# Patient Record
Sex: Male | Born: 1970 | Race: White | Hispanic: No | Marital: Married | Smoking: Never smoker
Health system: Southern US, Community
[De-identification: ages and names within clinical notes are randomized; demographics above are authoritative.]

## PROBLEM LIST (undated history)

## (undated) DIAGNOSIS — I10 Essential (primary) hypertension: Secondary | ICD-10-CM

## (undated) HISTORY — PX: CHOLECYSTECTOMY: SHX55

---

## 2011-04-06 ENCOUNTER — Other Ambulatory Visit: Payer: Self-pay | Admitting: Family Medicine

## 2011-04-06 ENCOUNTER — Encounter: Payer: Self-pay | Admitting: Family Medicine

## 2011-04-06 ENCOUNTER — Inpatient Hospital Stay (INDEPENDENT_AMBULATORY_CARE_PROVIDER_SITE_OTHER)
Admission: RE | Admit: 2011-04-06 | Discharge: 2011-04-06 | Disposition: A | Discharge status: Home / Self Care | Payer: BC Managed Care – PPO | Source: Ambulatory Visit | Attending: Family Medicine | Admitting: Family Medicine

## 2011-04-06 ENCOUNTER — Ambulatory Visit
Admission: RE | Admit: 2011-04-06 | Discharge: 2011-04-06 | Disposition: A | Discharge status: Home / Self Care | Payer: BC Managed Care – PPO | Source: Ambulatory Visit | Attending: Family Medicine | Admitting: Family Medicine

## 2011-04-06 DIAGNOSIS — J0100 Acute maxillary sinusitis, unspecified: Secondary | ICD-10-CM

## 2011-04-06 DIAGNOSIS — J3489 Other specified disorders of nose and nasal sinuses: Secondary | ICD-10-CM

## 2011-04-06 DIAGNOSIS — J029 Acute pharyngitis, unspecified: Secondary | ICD-10-CM

## 2011-04-06 DIAGNOSIS — H9209 Otalgia, unspecified ear: Secondary | ICD-10-CM

## 2011-04-09 ENCOUNTER — Telehealth (INDEPENDENT_AMBULATORY_CARE_PROVIDER_SITE_OTHER): Payer: Self-pay | Admitting: *Deleted

## 2011-10-20 NOTE — Progress Notes (Signed)
Summary: SHARP PAIN IN THROAT/TP(rm4)   Vital Signs:  Patient Profile:   40 Years Old Male CC:      SORE THROAT/SINUS/EAR INFECTION Height:     68 inches Weight:      166.4 pounds O2 Sat:      98 % O2 treatment:    Room Air Temp:     98.8 degrees F oral Pulse rate:   64 / minute Resp:     18 per minute BP sitting:   116 / 81  (left arm) Cuff size:   regular  Vitals Entered By: Linton Flemings RN (Apr 06, 2011 5:54 PM)                  Updated Prior Medication List: * AMOXICILLIN  CAPS (AMOXICILLIN) TWICE DAILY  Current Allergies: No known allergies History of Present Illness Chief Complaint: SORE THROAT/SINUS/EAR INFECTION History of Present Illness:  Subjective:  Patient complains of 2 month history of pressure in his right cheek and facial area, but with minimal sinus congestion.  For about a week he has had a mild right earache.  Four days ago he visited the [facility] and was started on amoxicillin 875mg  two times a day without much improvement.  Today he developed a sore throat.  No fevers, chills, and sweats.  No cough.  REVIEW OF SYSTEMS Constitutional Symptoms      Denies fever, chills, night sweats, weight loss, weight gain, and fatigue.  Eyes       Denies change in vision, eye pain, eye discharge, glasses, contact lenses, and eye surgery. Ear/Nose/Throat/Mouth       Complains of ear pain, sinus problems, and sore throat.      Denies hearing loss/aids, change in hearing, ear discharge, dizziness, frequent runny nose, frequent nose bleeds, hoarseness, and tooth pain or bleeding.  Respiratory       Denies dry cough, productive cough, wheezing, shortness of breath, asthma, bronchitis, and emphysema/COPD.  Cardiovascular       Denies murmurs, chest pain, and tires easily with exhertion.    Gastrointestinal       Denies stomach pain, nausea/vomiting, diarrhea, constipation, blood in bowel movements, and indigestion. Genitourniary       Denies painful  urination, kidney stones, and loss of urinary control. Neurological       Complains of headaches.      Denies paralysis, seizures, and fainting/blackouts. Musculoskeletal       Denies muscle pain, joint pain, joint stiffness, decreased range of motion, redness, swelling, muscle weakness, and gout.  Skin       Denies bruising, unusual mles/lumps or sores, and hair/skin or nail changes.  Psych       Denies mood changes, temper/anger issues, anxiety/stress, speech problems, depression, and sleep problems. Other Comments: symptoms started Wed tx w/ ABT for sinus infection w/out relief, sore throat started today and cont. ear pain.   Past History:  Past Medical History: Unremarkable  Past Surgical History: Denies surgical history  Family History: NONE  Social History: SMOKE-NO ALCOHOL- NO REC. DRUG- NO   Objective:  Appearance:  Patient appears healthy, stated age, and in no acute distress  Eyes:  Pupils are equal, round, and reactive to light and accomodation.  Extraocular movement is intact.  Conjunctivae are not inflamed.  Ears:  Canals normal.  Tympanic membranes normal.  No TMJ tenderness Nose:   Congested turbinates, worse on the right.  Right maxillary sinus tenderness present. Pharynx:  Normal  Neck:  Supple.  Slightly tender shotty anterior/posterior nodes are palpated bilaterally.  Lungs:  Clear to auscultation.  Breath sounds are equal.  Heart:  Regular rate and rhythm without murmurs, rubs, or gallops.  Abdomen:  Nontender without masses or hepatosplenomegaly.  Bowel sounds are present.  No CVA or flank tenderness.  Skin:  No rash Rapid strep test negative  Tympanometry:  normal bilaterally  Sinus X-rays:  IMPRESSION: Bilateral maxillary sinus mucosal thickening.  No definite air- fluid levels.  Possible left maxillary sinus mucous retention cyst.  Assessment New Problems: ACUTE MAXILLARY SINUSITIS (ICD-461.0) EAR PAIN, RIGHT (ICD-388.70) ACUTE PHARYNGITIS  (ICD-462) SINUS PAIN (ICD-478.19)   Plan New Medications/Changes: AMOXICILLIN 875 MG TABS (AMOXICILLIN) One by mouth two times a day  #20 x 0, 04/06/2011, Donna Christen MD TRAMADOL HCL 50 MG TABS (TRAMADOL HCL) One or two tabs by mouth q4 to 6hr as needed pain  #20 x 0, 04/06/2011, Donna Christen MD FLUTICASONE PROPIONATE 50 MCG/ACT SUSP (FLUTICASONE PROPIONATE) 2 sprays in each nostril once daily  #One x 1, 04/06/2011, Donna Christen MD PREDNISONE 10 MG TABS (PREDNISONE) 2 PO BID for 3 days, then 1 BID for 2 days, then 1 daily for 2 days.  Take PC  #14 x 0, 04/06/2011, Donna Christen MD  New Orders: T-DG Sinus 1-2 Views [70210] Rapid Strep [87880] T-Culture, Throat [[id number]] Tympanometry [13086] Services provided After hours-Weekends-Holidays [99051] New Patient Level V [99205] Planning Comments:   Begin amoxicillin, tapering course of prednisone, fluticasone nasal spray, topical decongestant, expectorant/decongestant,  saline nasal irrigation.   Increase fluid intake Followup with ENT if not improving 7 to 10 days   The patient and/or caregiver has been counseled thoroughly with regard to medications prescribed including dosage, schedule, interactions, rationale for use, and possible side effects and they verbalize understanding.  Diagnoses and expected course of recovery discussed and will return if not improved as expected or if the condition worsens. Patient and/or caregiver verbalized understanding.  Prescriptions: AMOXICILLIN 875 MG TABS (AMOXICILLIN) One by mouth two times a day  #20 x 0   Entered and Authorized by:   Donna Christen MD   Signed by:   Donna Christen MD on 04/06/2011   Method used:   Print then Give to Patient   RxID:   [id number] TRAMADOL HCL 50 MG TABS (TRAMADOL HCL) One or two tabs by mouth q4 to 6hr as needed pain  #20 x 0   Entered and Authorized by:   Donna Christen MD   Signed by:   Donna Christen MD on 04/06/2011   Method used:   Print then Give to  Patient   RxID:   [id number] FLUTICASONE PROPIONATE 50 MCG/ACT SUSP (FLUTICASONE PROPIONATE) 2 sprays in each nostril once daily  #One x 1   Entered and Authorized by:   Donna Christen MD   Signed by:   Donna Christen MD on 04/06/2011   Method used:   Print then Give to Patient   RxID:   [id number] PREDNISONE 10 MG TABS (PREDNISONE) 2 PO BID for 3 days, then 1 BID for 2 days, then 1 daily for 2 days.  Take PC  #14 x 0   Entered and Authorized by:   Donna Christen MD   Signed by:   Donna Christen MD on 04/06/2011   Method used:   Print then Give to Patient   RxID:   [id number]   Patient Instructions: 1)  Take Mucinex D (guaifenesin with decongestant) twice daily for congestion. 2)  Increase fluid intake  3)  May use Afrin nasal spray (or generic oxymetazoline) twice daily for about 5 days (use Afrin about 15 minutes before using prescription nasal spray).   Also recommend using saline nasal spray several times daily and/or saline nasal irrigation. 4)  Followup with ENT doctor if not improving 7 to 10 days.   Orders Added: 1)  T-DG Sinus 1-2 Views [70210] 2)  Rapid Strep [40981] 3)  T-Culture, Throat [[id number]] 4)  Tympanometry [92567] 5)  Services provided After hours-Weekends-Holidays [99051] 6)  New Patient Level V [99205]  Appended Document: SHARP PAIN IN THROAT/TP(rm4) Rapid Strep: Negative

## 2011-10-20 NOTE — Telephone Encounter (Signed)
  Phone Note Outgoing Call Call back at [facility] Phone [phone]   Call placed by: Lajean Saver RN,  Apr 09, 2011 1:45 PM Call placed to: Patient Summary of Call: Callback: No answer. Message left to call back with questions or concerns and to receive throat culture results

## 2013-12-08 ENCOUNTER — Encounter (HOSPITAL_COMMUNITY): Payer: Self-pay | Admitting: Emergency Medicine

## 2013-12-08 ENCOUNTER — Emergency Department (HOSPITAL_COMMUNITY)
Admission: EM | Admit: 2013-12-08 | Discharge: 2013-12-08 | Disposition: A | Discharge status: Home / Self Care | Payer: BC Managed Care – PPO | Attending: Emergency Medicine | Admitting: Emergency Medicine

## 2013-12-08 ENCOUNTER — Emergency Department (HOSPITAL_COMMUNITY): Payer: BC Managed Care – PPO

## 2013-12-08 DIAGNOSIS — M546 Pain in thoracic spine: Secondary | ICD-10-CM | POA: Insufficient documentation

## 2013-12-08 DIAGNOSIS — F411 Generalized anxiety disorder: Secondary | ICD-10-CM | POA: Insufficient documentation

## 2013-12-08 DIAGNOSIS — M549 Dorsalgia, unspecified: Secondary | ICD-10-CM

## 2013-12-08 DIAGNOSIS — R079 Chest pain, unspecified: Secondary | ICD-10-CM | POA: Insufficient documentation

## 2013-12-08 LAB — POCT I-STAT, CHEM 8
BUN: 15 mg/dL (ref 6–23)
CALCIUM ION: 1.16 mmol/L (ref 1.12–1.23)
CREATININE: 1.1 mg/dL (ref 0.50–1.35)
Chloride: 101 mEq/L (ref 96–112)
Glucose, Bld: 92 mg/dL (ref 70–99)
HCT: 52 % (ref 39.0–52.0)
HEMOGLOBIN: 17.7 g/dL — AB (ref 13.0–17.0)
Potassium: 3.6 mEq/L — ABNORMAL LOW (ref 3.7–5.3)
SODIUM: 141 meq/L (ref 137–147)
TCO2: 29 mmol/L (ref 0–100)

## 2013-12-08 LAB — POCT I-STAT TROPONIN I: Troponin i, poc: 0 ng/mL (ref 0.00–0.08)

## 2013-12-08 NOTE — ED Provider Notes (Signed)
CSN: [id number]     Arrival date & time 12/08/13  1405 History   First MD Initiated Contact with Patient 12/08/13 1423     Chief Complaint  Patient presents with  . Chest Pain   (Consider location/radiation/quality/duration/timing/severity/associated sxs/prior Treatment) Patient is a 43 y.o. male presenting with chest pain. The history is provided by the patient.  Chest Pain  He developed chest tightness about 2 hours ago. He feels like a "restriction". He was transferred by EMS, who gave him aspirin. Here for similar episode 2 weeks ago. He does not know the provocation for the discomfort. He has been concerned about his blood pressure and checked it at home several days ago, at that time. It was 164/108. Has a family history of early myocardial infarction in his father, he was about 50 at the time. Patient's cholesterol has been normal on screening at work. He's never had cardiac problems, previously. He does not have a PCP. There are no other known modifying factors.  History reviewed. No pertinent past medical history. History reviewed. No pertinent past surgical history. History reviewed. No pertinent family history. History  Substance Use Topics  . Smoking status: Never Smoker   . Smokeless tobacco: Not on file  . Alcohol Use: No    Review of Systems  Cardiovascular: Positive for chest pain.  All other systems reviewed and are negative.    Allergies  Review of patient's allergies indicates no known allergies.  Home Medications  No current outpatient prescriptions on file. BP 145/97  Pulse 69  Temp(Src) 98.4 F (36.9 C) (Oral)  Resp 12  SpO2 99% Physical Exam  Nursing note and vitals reviewed. Constitutional: He is oriented to person, place, and time. He appears well-developed and well-nourished.  HENT:  Head: Normocephalic and atraumatic.  Right Ear: External ear normal.  Left Ear: External ear normal.  Eyes: Conjunctivae and EOM are normal. Pupils are equal,  round, and reactive to light.  Neck: Normal range of motion and phonation normal. Neck supple.  Cardiovascular: Normal rate, regular rhythm, normal heart sounds and intact distal pulses.   Pulmonary/Chest: Effort normal and breath sounds normal. He exhibits no bony tenderness.  Abdominal: Soft. Normal appearance. There is no tenderness.  Musculoskeletal: Normal range of motion.  Neurological: He is alert and oriented to person, place, and time. No cranial nerve deficit or sensory deficit. He exhibits normal muscle tone. Coordination normal.  Skin: Skin is warm, dry and intact.  Psychiatric: His behavior is normal. Judgment and thought content normal.  He is anxious    ED Course  Procedures (including critical care time)  Medications - No data to display  Patient Vitals for the past 24 hrs:  BP Temp Temp src Pulse Resp SpO2  12/08/13 1804 145/97 mmHg - - 69 - 99 %  12/08/13 1621 151/96 mmHg - - - 12 99 %  12/08/13 1445 147/88 mmHg - - 66 14 94 %  12/08/13 1424 138/88 mmHg 98.4 F (36.9 C) Oral 64 13 99 %    6:20 PM Reevaluation with update and discussion. After initial assessment and treatment, an updated evaluation reveals he denies chest discomfort. He feels that he has pain in his left upper back that radiates to the left upper neck . Jaxn Chiquito L     Labs Review Labs Reviewed  POCT I-STAT, CHEM 8 - Abnormal; Notable for the following:    Potassium 3.6 (*)    Hemoglobin 17.7 (*)    All other components within normal  limits  POCT I-STAT TROPONIN I   Imaging Review Dg Chest 2 View  12/08/2013   CLINICAL DATA:  Chest tightness  EXAM: CHEST  2 VIEW  COMPARISON:  None.  FINDINGS: The heart size and mediastinal contours are within normal limits. Both lungs are clear. The visualized skeletal structures are unremarkable. Minor upper thoracic scoliosis.  IMPRESSION: No active cardiopulmonary disease.   Electronically Signed   By: Ruel Favorsrevor  Shick M.D.   On: 12/08/2013 16:51    EKG  Interpretation    Date/Time:  Thursday December 08 2013 14:12:03 EST Ventricular Rate:  69 PR Interval:  142 QRS Duration: 108 QT Interval:  380 QTC Calculation: 407 R Axis:   63 Text Interpretation:  Sinus rhythm No old tracing to compare Confirmed by [facility]  MD, Kechia Yahnke ([id number]) on 12/08/2013 2:24:27 PM            MDM   1. Chest pain   2. Back pain     Nonspecific chest pain. EKG is reassuring. He is PERC negative; doubt PE or DVT. Borderline elevation of blood pressure on repeat testing. No evidence for hypertensive emergency, ACS, PE, or pneumonia  Nursing Notes Reviewed/ Care Coordinated Applicable Imaging Reviewed Interpretation of Laboratory Data incorporated into ED treatment  The patient appears reasonably screened and/or stabilized for discharge and I doubt any other medical condition or other [facility] requiring further screening, evaluation, or treatment in the ED at this time prior to discharge.  Plan: Home Medications- Ibuprofen; Home Treatments- heat to sore area; return here if the recommended treatment, does not improve the symptoms; Recommended follow up- PCP of choice for BP check in 2 weeks   Flint MelterElliott L Lurena Naeve, MD 12/08/13 [phone]

## 2013-12-08 NOTE — ED Notes (Signed)
Patient transported to X-ray 

## 2013-12-08 NOTE — Discharge Instructions (Signed)
Followup with a primary care doctor in one or 2 weeks for recheck of your blood pressure. For the back discomfort, use heat on the sore areas and take ibuprofen, for pain.     Chest Pain (Nonspecific) It is often hard to give a specific diagnosis for the cause of chest pain. There is always a chance that your pain could be related to something serious, such as a heart attack or a blood clot in the lungs. You need to follow up with your caregiver for further evaluation. CAUSES   Heartburn.  Pneumonia or bronchitis.  Anxiety or stress.  Inflammation around your heart (pericarditis) or lung (pleuritis or pleurisy).  A blood clot in the lung.  A collapsed lung (pneumothorax). It can develop suddenly on its own (spontaneous pneumothorax) or from injury (trauma) to the chest.  Shingles infection (herpes zoster virus). The chest wall is composed of bones, muscles, and cartilage. Any of these can be the source of the pain.  The bones can be bruised by injury.  The muscles or cartilage can be strained by coughing or overwork.  The cartilage can be affected by inflammation and become sore (costochondritis). DIAGNOSIS  Lab tests or other studies, such as X-rays, electrocardiography, stress testing, or cardiac imaging, may be needed to find the cause of your pain.  TREATMENT   Treatment depends on what may be causing your chest pain. Treatment may include:  Acid blockers for heartburn.  Anti-inflammatory medicine.  Pain medicine for inflammatory conditions.  Antibiotics if an infection is present.  You may be advised to change lifestyle habits. This includes stopping smoking and avoiding alcohol, caffeine, and chocolate.  You may be advised to keep your head raised (elevated) when sleeping. This reduces the chance of acid going backward from your stomach into your esophagus.  Most of the time, nonspecific chest pain will improve within 2 to 3 days with rest and mild pain  medicine. HOME CARE INSTRUCTIONS   If antibiotics were prescribed, take your antibiotics as directed. Finish them even if you start to feel better.  For the next few days, avoid physical activities that bring on chest pain. Continue physical activities as directed.  Do not smoke.  Avoid drinking alcohol.  Only take over-the-counter or prescription medicine for pain, discomfort, or fever as directed by your caregiver.  Follow your caregiver's suggestions for further testing if your chest pain does not go away.  Keep any follow-up appointments you made. If you do not go to an appointment, you could develop lasting (chronic) problems with pain. If there is any problem keeping an appointment, you must call to reschedule. SEEK MEDICAL CARE IF:   You think you are having problems from the medicine you are taking. Read your medicine instructions carefully.  Your chest pain does not go away, even after treatment.  You develop a rash with blisters on your chest. SEEK IMMEDIATE MEDICAL CARE IF:   You have increased chest pain or pain that spreads to your arm, neck, jaw, back, or abdomen.  You develop shortness of breath, an increasing cough, or you are coughing up blood.  You have severe back or abdominal pain, feel nauseous, or vomit.  You develop severe weakness, fainting, or chills.  You have a fever. THIS IS AN EMERGENCY. Do not wait to see if the pain will go away. Get medical help at once. Call your local emergency services (911 in U.S.). Do not drive yourself to the hospital. MAKE SURE YOU:   Understand  these instructions.  Will watch your condition.  Will get help right away if you are not doing well or get worse. Document Released: 08/13/2005 Document Revised: 01/26/2012 Document Reviewed: 06/08/2008 [facility] Patient Information 2014 [employer], [place].  Musculoskeletal Pain Musculoskeletal pain is muscle and boney aches and pains. These pains can occur in any part of the  body. Your caregiver may treat you without knowing the cause of the pain. They may treat you if blood or urine tests, X-rays, and other tests were normal.  CAUSES There is often not a definite cause or reason for these pains. These pains may be caused by a type of germ (virus). The discomfort may also come from overuse. Overuse includes working out too hard when your body is not fit. Boney aches also come from weather changes. Bone is sensitive to atmospheric pressure changes. HOME CARE INSTRUCTIONS   Ask when your test results will be ready. Make sure you get your test results.  Only take over-the-counter or prescription medicines for pain, discomfort, or fever as directed by your caregiver. If you were given medications for your condition, do not drive, operate machinery or power tools, or sign legal documents for 24 hours. Do not drink alcohol. Do not take sleeping pills or other medications that may interfere with treatment.  Continue all activities unless the activities cause more pain. When the pain lessens, slowly resume normal activities. Gradually increase the intensity and duration of the activities or exercise.  During periods of severe pain, bed rest may be helpful. Lay or sit in any position that is comfortable.  Putting ice on the injured area.  Put ice in a bag.  Place a towel between your skin and the bag.  Leave the ice on for 15 to 20 minutes, 3 to 4 times a day.  Follow up with your caregiver for continued problems and no reason can be found for the pain. If the pain becomes worse or does not go away, it may be necessary to repeat tests or do additional testing. Your caregiver may need to look further for a possible cause. SEEK IMMEDIATE MEDICAL CARE IF:  You have pain that is getting worse and is not relieved by medications.  You develop chest pain that is associated with shortness or breath, sweating, feeling sick to your stomach (nauseous), or throw up (vomit).  Your  pain becomes localized to the abdomen.  You develop any new symptoms that seem different or that concern you. MAKE SURE YOU:   Understand these instructions.  Will watch your condition.  Will get help right away if you are not doing well or get worse. Document Released: 11/03/2005 Document Revised: 01/26/2012 Document Reviewed: 07/08/2013 [facility] Patient Information 2014 [place]Care, [place]LLC.    Emergency Department Resource Guide 1) Find a Doctor and Pay Out of Pocket Although you won't have to find out who is covered by your insurance plan, it is a good idea to ask around and get recommendations. You will then need to call the office and see if the doctor you have chosen will accept you as a new patient and what types of options they offer for patients who are self-pay. Some doctors offer discounts or will set up payment plans for their patients who do not have insurance, but you will need to ask so you aren't surprised when you get to your appointment.  2) Contact Your Local Health Department Not all health departments have doctors that can see patients for sick visits, but many do,  so it is worth a call to see if yours does. If you don't know where your local health department is, you can check in your phone book. The CDC also has a tool to help you locate your state's health department, and many state websites also have listings of all of their local health departments.  3) Find a Walk-in Clinic If your illness is not likely to be very severe or complicated, you may want to try a walk in clinic. These are popping up all over the country in pharmacies, drugstores, and shopping centers. They're usually staffed by nurse practitioners or physician assistants that have been trained to treat common illnesses and complaints. They're usually fairly quick and inexpensive. However, if you have serious medical issues or chronic medical problems, these are probably not your best option.  No Primary  Care Doctor: - Call Health Connect at  [phone] - they can help you locate a primary care doctor that  accepts your insurance, provides certain services, etc. - Physician Referral Service- [phone]  Chronic Pain Problems: Organization         Address  Phone   Notes  [facility]  [phone] Patients need to be referred by their primary care doctor.   Medication Assistance: Organization         Address  Phone   Notes  [facility] Medication [facility] [address] [place] [place], [place] [address] [phone] --Must be a resident of [facility] -- Must have NO insurance coverage whatsoever (no Medicaid/ Medicare, etc.) -- The pt. MUST have a primary care doctor that directs their care regularly and follows them in the community   [facility]  [phone]   [employer]  [phone]    Agencies that provide inexpensive medical care: Organization         Address  Phone   Notes  [facility]  [phone]   [facility]    [phone]   [facility] [address] [place], [place] [address] [phone]   [facility] [address] [place], [place] [phone]   Planned Parenthood    [phone]   [facility]    [phone]   [facility]  [address], [place] Phone:  [phone], Fax:  [phone] Hours of Operation:  9 am - 6 pm, M-F.  Also accepts Medicaid/Medicare and self-pay.  [facility]  [address], [place] Phone: [phone], Fax: [phone]. Hours of Operation:  8:30 am - 5:30 pm, M-F.  Also accepts Medicaid and self-pay.  [facility] [place] [address] [place] Phone: [phone]   [facility] [address] Natasha Bence [place], [place] [phone], Ext. [phone] Mondays & Thursdays: 7-9 AM.  First 15 patients are seen on a first  come, first serve basis.    Medicaid-accepting [facility] Providers:  Organization         Address  Phone   Notes  [facility] [address], [place] [phone] Also accepts self-pay patients.  [facility] [address] Laurell Josephs [place], [place]  [phone]   [facility] [address], [place] [phone]   [facility] [address], [place] [phone]   Renaye Rakers [phone]  [address], [place]   [phone] Only accepts [place] patients after they have their name applied to their card.   Self-Pay (no insurance) in [facility]:  Organization         Address  Phone   Notes  Sickle Cell Patients, [facility] Internal Medicine [address] [place], [place] [phone]   [facility] [address] [place], [place] [phone]   [facility]  [address], [place] [phone]   [facility]/Dr. Osei-Bonsu  [address], [place] or [address], [place] [phone] Phone number for both [place] and [place] locations is the same.  Urgent Medical and [facility] [address] [place] [phone]   [facility] [address] [place] or [address] [phone]   [facility] [address] [place] [phone], phone; [phone], fax Sees patients 1st and 3rd Saturday of every month.  Must not qualify for public or private insurance (i.e. Medicaid, Medicare, [place] Health Choice, Veterans' Benefits)  Household income should be no more than 200% of the poverty level The clinic cannot treat you if you are pregnant or think you are pregnant  Sexually transmitted diseases are not treated at the clinic.    Dental Care: Organization          Address  Phone  Notes  [facility] Department of [facility] [address] [place], [place] [phone] Accepts children up to age 5 who are enrolled in [place] or [place] Health Choice; pregnant women with a Medicaid card; and children who have applied for Medicaid or [place] Health Choice, but were declined, whose parents can pay a reduced fee at time of service.  [facility] of [facility]  [address], [place] [phone] Accepts children up to age 14 who are enrolled in [place] or [place] Health Choice; pregnant women with a Medicaid card; and children who have applied for Medicaid or  Health Choice, but were declined, whose parents can pay a reduced fee at time of service.  [facility]  [address] [place], [place] [phone] Patients are seen by appointment only. Walk-ins are not accepted. [facility] will see patients 62 years of age and older. Monday - Tuesday (8am-5pm) Most Wednesdays (8:30-5pm) $30 per visit, cash only  [facility] Adult Dental Access PRO[facility]  [address], [facility] [phone] Patients are seen by appointment only. Walk-ins are not accepted. [facility] will see patients 40 years of age and older. One Wednesday Evening (Monthly: Volunteer Based).  $30 per visit, cash only  [employer]  [phone] for adults; Children under age 71, call Graduate Pediatric Dentistry at [phone]. Children aged 39-14, please call [phone] to request a pediatric application.  Dental services are provided in all areas of dental care including fillings, crowns and bridges, complete and partial dentures, implants, gum treatment, root canals, and extractions. Preventive care is also provided. Treatment is provided to both adults and children. Patients are selected via a lottery and there is often a waiting list.   [facility] [address] [place]  [phone] [email or web address]   [facility] [address] [place], [place] [phone], Ext. [phone] Second and  Fourth Thursday of each month, opens at 6:30 AM; Clinic ends at 9 AM.  Patients are seen on a first-come first-served basis, and a limited number are seen during each clinic.   [facility]  [address] Ether Griffins [place], [place] [phone]   Eligibility Requirements You must have lived in [place], [place], or [place] counties for at least the last three months.   You cannot be eligible for state or federal sponsored [place], including [employer], [place], or [employer].   You generally cannot be eligible for healthcare insurance through your employer.    How to apply: Eligibility screenings are held every Tuesday and Wednesday afternoon from 1:00 pm until 4:00 pm. You do not need an appointment for the interview!  [facility] [address], [place], [place] [phone]   [facility] Department  [phone]   [facility] Health Department  [phone]   [facility] Health Department  [phone]    Behavioral Health Resources in the Community: Intensive Outpatient Programs Organization         Address  Phone  Notes  [facility] [address], [place], [place] [phone]   [facility] Outpatient [address] [place], [place] [phone]   [facility] [address] [place], [place]  [phone]   [facility] Mental Health [address],  [place], [place] [phone] or [phone]   Substance Abuse Resources Organization         Address  Phone  Notes  [facility] Drug Services  [phone]   [facility]  [phone]   [facility]  [phone]   Floydene Flock  [phone]   Residential & Outpatient Substance Abuse Program  [phone]   Psychological  Services Organization         Address  Phone  Notes  [facility]  [phone]   [facility]  [phone]   [facility] [address], [place] [phone] or [phone]    Mobile Crisis Teams Organization         Address  Phone  Notes  [facility], Mobile Crisis Care Unit  [phone]   [facility]  [address] [place], [place] [phone]   Doristine Locks [address] [place] [phone]    Self-Help/Support Groups Organization         Address  Phone             Notes  [facility] [place] - variety of support groups  336- I7437963 Call for more information  Narcotics Anonymous (NA), [facility] [address], [employer] [place]  2 meetings at this location   Statistician         Address  Phone  Notes  [facility] [address],    [place]  [phone]   [facility]  [address] [place] [address], [place], [place] [phone]   [facility] [address] [place], [place] [phone] Admissions: 8am-3pm M-F  [facility] [address],    [place], [place] [phone]   [facility] [address] Starling Manns [place], [place] [phone]   [facility] [address]  [place], [place] [phone]   [facility] - Intensive Outpatient [address] Laurell Josephs [address], [place], [place] [phone]   [facility] ([facility] [address]  [place], [place] [phone] or [phone]   [facility] ([facility]) [address] [place], [place] [phone] Accepts Medicaid  [facility] [address],  [place] [phone] Substance Abuse/Addiction Treatment   [facility] Organization         Address  Phone  Notes  [facility]  [phone]   Angie Fava, PhD [address] Ervin Knack [place], [place]   [phone] or [phone]   [facility]   [address] [place], [place] [phone]   [facility] [address], [place], [place] [phone] Insurance/Medicaid/sponsorship through [facility] and Families [address]                                    [place], [place] [phone] Therapy/tele-psych/case  [facility] [address], [place] [phone]    Dr. Lolly Mustache  [phone]   [facility] [place]  [employer] [facility] Dept. 1) [address] [place] 2) [address] [place] 3)  [address], [place] [phone]  [phone]   [facility] Child Abuse Hotline 838-814-4597 or [phone] (After Hours)

## 2013-12-08 NOTE — ED Notes (Signed)
[facility] complaint of "chest tightness" starting approximately 1 hour ago.  Patient was standing up at work when symptoms started.

## 2014-03-10 ENCOUNTER — Encounter: Payer: Self-pay | Admitting: Sports Medicine

## 2014-03-10 ENCOUNTER — Ambulatory Visit (INDEPENDENT_AMBULATORY_CARE_PROVIDER_SITE_OTHER): Payer: BC Managed Care – PPO | Admitting: Sports Medicine

## 2014-03-10 VITALS — BP 142/94 | HR 65 | Ht 68.0 in | Wt 168.0 lb

## 2014-03-10 DIAGNOSIS — Z299 Encounter for prophylactic measures, unspecified: Secondary | ICD-10-CM

## 2014-03-10 DIAGNOSIS — M5416 Radiculopathy, lumbar region: Secondary | ICD-10-CM

## 2014-03-10 DIAGNOSIS — Z0000 Encounter for general adult medical examination without abnormal findings: Secondary | ICD-10-CM | POA: Insufficient documentation

## 2014-03-10 DIAGNOSIS — M47816 Spondylosis without myelopathy or radiculopathy, lumbar region: Secondary | ICD-10-CM | POA: Insufficient documentation

## 2014-03-10 DIAGNOSIS — R0789 Other chest pain: Secondary | ICD-10-CM | POA: Insufficient documentation

## 2014-03-10 DIAGNOSIS — IMO0002 Reserved for concepts with insufficient information to code with codable children: Secondary | ICD-10-CM

## 2014-03-10 NOTE — Assessment & Plan Note (Signed)
Right-sided, has failed physical therapy, steroids, NSAIDs, x-rays were negative. At this point we are going to proceed with MRI for interventional injection planning.

## 2014-03-10 NOTE — Assessment & Plan Note (Signed)
Present for a year, does not sound cardiac. We will put this on the back burner until we get his back figured out.

## 2014-03-10 NOTE — Progress Notes (Signed)
  Subjective:    CC: Low back pain  HPI:  This is a pleasant 43 year old male, he comes in with a two-year history of pain he localizes in his low back, with occasional numbness radiation down the left leg, but not past  the knee. He has already had 6 weeks of formal physical therapy, steroids, NSAIDs, muscle relaxers, nothing is helped. He also had x-rays which were negative. Is moderate, persistent.   Chest tightness: Intermittent, feels like he has difficulty taking a deep breath. Pain is nonexertional, there is no nausea, diaphoresis, presyncope associated with it. He is amenable to discussing this at a future visit.  Past medical history, Surgical history, Family history not pertinant except as noted below, Social history, Allergies, and medications have been entered into the medical record, reviewed, and no changes needed.   Review of Systems: No headache, visual changes, nausea, vomiting, diarrhea, constipation, dizziness, abdominal pain, skin rash, fevers, chills, night sweats, swollen lymph nodes, weight loss, chest pain, body aches, joint swelling, muscle aches, shortness of breath, mood changes, visual or auditory hallucinations.  Objective:    General: Well Developed, well nourished, and in no acute distress.  Neuro: Alert and oriented x3, extra-ocular muscles intact, sensation grossly intact.  HEENT: Normocephalic, atraumatic, pupils equal round reactive to light, neck supple, no masses, no lymphadenopathy, thyroid nonpalpable.  Skin: Warm and dry, no rashes noted.  Cardiac: Regular rate and rhythm, no murmurs rubs or gallops.  Respiratory: Clear to auscultation bilaterally. Not using accessory muscles, speaking in full sentences.  Abdominal: Soft, nontender, nondistended, positive bowel sounds, no masses, no organomegaly.  Back Exam:  Inspection: Unremarkable  Motion: Flexion 45 deg, Extension 45 deg, Side Bending to 45 deg bilaterally,  Rotation to 45 deg bilaterally  SLR  laying: Negative  XSLR laying: Negative  Palpable tenderness: None. FABER: negative. Sensory change: Gross sensation intact to all lumbar and sacral dermatomes.  Reflexes: 2+ at both patellar tendons, 2+ at achilles tendons, Babinski's downgoing.  Strength at foot  Plantar-flexion: 5/5 Dorsi-flexion: 5/5 Eversion: 5/5 Inversion: 5/5  Leg strength  Quad: 5/5 Hamstring: 5/5 Hip flexor: 5/5 Hip abductors: 5/5  Gait unremarkable.  Impression and Recommendations:    The patient was counselled, risk factors were discussed, anticipatory guidance given.

## 2014-03-16 ENCOUNTER — Encounter: Payer: Self-pay | Admitting: Sports Medicine

## 2014-03-16 ENCOUNTER — Ambulatory Visit (INDEPENDENT_AMBULATORY_CARE_PROVIDER_SITE_OTHER): Payer: BC Managed Care – PPO | Admitting: Sports Medicine

## 2014-03-16 ENCOUNTER — Telehealth: Payer: Self-pay | Admitting: *Deleted

## 2014-03-16 VITALS — BP 142/90 | HR 68 | Ht 68.0 in | Wt 171.0 lb

## 2014-03-16 DIAGNOSIS — R0789 Other chest pain: Secondary | ICD-10-CM

## 2014-03-16 DIAGNOSIS — L081 Erythrasma: Secondary | ICD-10-CM | POA: Insufficient documentation

## 2014-03-16 DIAGNOSIS — A4289 Other forms of actinomycosis: Secondary | ICD-10-CM

## 2014-03-16 MED ORDER — TRIAMCINOLONE ACETONIDE 0.5 % EX CREA: 1.0000 "application " | TOPICAL_CREAM | Freq: Two times a day (BID) | CUTANEOUS | Status: DC

## 2014-03-16 MED ORDER — MOMETASONE FURO-FORMOTEROL FUM 200-5 MCG/ACT IN AERO: 2.0000 | INHALATION_SPRAY | Freq: Two times a day (BID) | RESPIRATORY_TRACT | Status: DC

## 2014-03-16 MED ORDER — AZITHROMYCIN 250 MG PO TABS: ORAL_TABLET | ORAL | Status: DC

## 2014-03-16 NOTE — Telephone Encounter (Signed)
Per [employer] no precert required for MRI Lumbar Spine. Barry DienesKimberly Gordon, LPN

## 2014-03-16 NOTE — Assessment & Plan Note (Addendum)
Colin Carter declines spirometry or any other testing. This does not sound cardiac. I am going to just give him an inhaled corticosteroid/long-acting bronchodilator combination. He can tell me this helps.

## 2014-03-16 NOTE — Progress Notes (Signed)
  Subjective:    CC: Followup  HPI: Chest tightness: Worse when the weather is cold, and when he works long days on his feet. He describes his symptoms as difficulty getting a full breath, he really has only mild symptoms, and declines any form of diagnostic workup. He has no exertional chest pain, no radiation to the arm or jaw, no nausea, diaphoresis, presyncopal symptoms.  Rash: Localized in the right axilla, highly pruritic.  Past medical history, Surgical history, Family history not pertinant except as noted below, Social history, Allergies, and medications have been entered into the medical record, reviewed, and no changes needed.   Review of Systems: No fevers, chills, night sweats, weight loss, chest pain, or shortness of breath.   Objective:    General: Well Developed, well nourished, and in no acute distress.  Neuro: Alert and oriented x3, extra-ocular muscles intact, sensation grossly intact.  HEENT: Normocephalic, atraumatic, pupils equal round reactive to light, neck supple, no masses, no lymphadenopathy, thyroid nonpalpable.  Skin: Warm and dry, there is a well-defined, plaque-like, erythematous, and excoriated rash in the right axilla. Cardiac: Regular rate and rhythm, no murmurs rubs or gallops, no lower extremity edema.  Respiratory: Clear to auscultation bilaterally. Not using accessory muscles, speaking in full sentences.  Impression and Recommendations:

## 2014-03-16 NOTE — Assessment & Plan Note (Signed)
We will start with azithromycin, and topical Kenalog. Return in a couple of weeks if no better.

## 2014-04-12 ENCOUNTER — Ambulatory Visit (INDEPENDENT_AMBULATORY_CARE_PROVIDER_SITE_OTHER): Payer: BC Managed Care – PPO

## 2014-04-12 ENCOUNTER — Other Ambulatory Visit: Payer: BC Managed Care – PPO

## 2014-04-12 DIAGNOSIS — M5137 Other intervertebral disc degeneration, lumbosacral region: Secondary | ICD-10-CM

## 2014-04-12 DIAGNOSIS — M5416 Radiculopathy, lumbar region: Secondary | ICD-10-CM

## 2014-04-12 DIAGNOSIS — M5126 Other intervertebral disc displacement, lumbar region: Secondary | ICD-10-CM

## 2014-04-14 ENCOUNTER — Ambulatory Visit: Payer: BC Managed Care – PPO | Admitting: Sports Medicine

## 2014-05-12 ENCOUNTER — Encounter: Payer: Self-pay | Admitting: Sports Medicine

## 2014-05-12 ENCOUNTER — Ambulatory Visit (INDEPENDENT_AMBULATORY_CARE_PROVIDER_SITE_OTHER): Payer: BC Managed Care – PPO | Admitting: Sports Medicine

## 2014-05-12 VITALS — BP 146/92 | HR 68 | Ht 68.0 in | Wt 165.0 lb

## 2014-05-12 DIAGNOSIS — M47817 Spondylosis without myelopathy or radiculopathy, lumbosacral region: Secondary | ICD-10-CM

## 2014-05-12 DIAGNOSIS — M47816 Spondylosis without myelopathy or radiculopathy, lumbar region: Secondary | ICD-10-CM

## 2014-05-12 MED ORDER — MELOXICAM 15 MG PO TABS: ORAL_TABLET | ORAL | Status: DC

## 2014-05-12 MED ORDER — GABAPENTIN 300 MG PO CAPS: ORAL_CAPSULE | ORAL | Status: DC

## 2014-05-12 NOTE — Progress Notes (Signed)
  Subjective:    CC: MRI results  HPI: Low back pain: left-sided, radiation down into the buttock and the left lateral side without radiation past the knee. Better with sitting, better with flexion, not worse with Valsalva.  Past medical history, Surgical history, Family history not pertinant except as noted below, Social history, Allergies, and medications have been entered into the medical record, reviewed, and no changes needed.   Review of Systems: No fevers, chills, night sweats, weight loss, chest pain, or shortness of breath.   Objective:    General: Well Developed, well nourished, and in no acute distress.  Neuro: Alert and oriented x3, extra-ocular muscles intact, sensation grossly intact.  HEENT: Normocephalic, atraumatic, pupils equal round reactive to light, neck supple, no masses, no lymphadenopathy, thyroid nonpalpable.  Skin: Warm and dry, no rashes. Cardiac: Regular rate and rhythm, no murmurs rubs or gallops, no lower extremity edema.  Respiratory: Clear to auscultation bilaterally. Not using accessory muscles, speaking in full sentences.  MRI shows desiccation at the L5-S1 disc with some broad based protrusion, but no foraminal stenosis. On my personal review he also has some left-sided L5-S1 facet spondylosis.  Impression and Recommendations:

## 2014-05-12 NOTE — Assessment & Plan Note (Signed)
Pain is in the low back, better with flexion, Valsalva, Friday and car suggestive of facet mediated pain. MRI does show what appears to be left-sided L5-S1 mild facet arthrosis, though this is a very soft call. He does have desiccation of the L5-S1 disc, but there is no protrusion. I'm going to start Mobic and gabapentin. He will decide whether or not he wants to proceed with a left-sided L5-S1 facet injection, certainly if all of the above fails we could consider left-sided L5-S1 epidural.

## 2014-05-12 NOTE — Patient Instructions (Signed)
call if he decides to do the facet injection.

## 2014-06-23 ENCOUNTER — Encounter: Payer: Self-pay | Admitting: Sports Medicine

## 2014-06-23 ENCOUNTER — Ambulatory Visit (INDEPENDENT_AMBULATORY_CARE_PROVIDER_SITE_OTHER): Payer: BC Managed Care – PPO | Admitting: Sports Medicine

## 2014-06-23 VITALS — BP 152/96 | HR 58 | Ht 68.0 in | Wt 165.0 lb

## 2014-06-23 DIAGNOSIS — H539 Unspecified visual disturbance: Secondary | ICD-10-CM | POA: Insufficient documentation

## 2014-06-23 DIAGNOSIS — R030 Elevated blood-pressure reading, without diagnosis of hypertension: Secondary | ICD-10-CM

## 2014-06-23 DIAGNOSIS — I10 Essential (primary) hypertension: Secondary | ICD-10-CM | POA: Insufficient documentation

## 2014-06-23 DIAGNOSIS — Z299 Encounter for prophylactic measures, unspecified: Secondary | ICD-10-CM

## 2014-06-23 DIAGNOSIS — M47817 Spondylosis without myelopathy or radiculopathy, lumbosacral region: Secondary | ICD-10-CM

## 2014-06-23 DIAGNOSIS — Z0000 Encounter for general adult medical examination without abnormal findings: Secondary | ICD-10-CM

## 2014-06-23 DIAGNOSIS — M47816 Spondylosis without myelopathy or radiculopathy, lumbar region: Secondary | ICD-10-CM

## 2014-06-23 NOTE — Progress Notes (Signed)
  Subjective:    CC: Complete physical exam  HPI:  This is a very pleasant 43 year old male who comes in for a complete physical, this is required for his job. His only complaint is occasional visual disturbances that he describes like seeing smoke.  Elevated blood pressure: Has been elevated on every single occasion, his home readings are low, 120-110s over 70s.  Past medical history, Surgical history, Family history not pertinant except as noted below, Social history, Allergies, and medications have been entered into the medical record, reviewed, and no changes needed.   Review of Systems: No headache, visual changes, nausea, vomiting, diarrhea, constipation, dizziness, abdominal pain, skin rash, fevers, chills, night sweats, swollen lymph nodes, weight loss, chest pain, body aches, joint swelling, muscle aches, shortness of breath, mood changes, visual or auditory hallucinations.  Objective:    General: Well Developed, well nourished, and in no acute distress.  Neuro: Alert and oriented x3, extra-ocular muscles intact, sensation grossly intact. Cranial nerves II through XII are intact, motor, sensory, and coordinative functions are all intact. HEENT: Normocephalic, atraumatic, pupils equal round reactive to light, neck supple, no masses, no lymphadenopathy, thyroid nonpalpable. Oropharynx, nasopharynx, external ear canals are unremarkable. Funduscopy shows flame hemorrhages. Skin: Warm and dry, no rashes noted.  Cardiac: Regular rate and rhythm, no murmurs rubs or gallops.  Respiratory: Clear to auscultation bilaterally. Not using accessory muscles, speaking in full sentences.  Abdominal: Soft, nontender, nondistended, positive bowel sounds, no masses, no organomegaly.  Musculoskeletal: Shoulder, elbow, wrist, hip, knee, ankle stable, and with full range of motion.  Impression and Recommendations:    The patient was counselled, risk factors were discussed, anticipatory guidance  given.

## 2014-06-23 NOTE — Assessment & Plan Note (Signed)
Complete physical performed today. 

## 2014-06-23 NOTE — Assessment & Plan Note (Signed)
There is facet mediated pain, MRI shows a left-sided L5-S1 facet arthritis, he also has L5-S1 disc desiccation without protrusion. Overall he is feeling well with occasional Mobic and gabapentin, and does not want to proceed any further with intervention.

## 2014-06-23 NOTE — Assessment & Plan Note (Signed)
Nonspecific though he does have persistently elevated blood pressures here in the office. Referral to optometry.

## 2014-06-23 NOTE — Assessment & Plan Note (Signed)
Pressures tend to be very elevated here in the office, but his home readings are in the 120s over 80s. This may represent simple white coat hypertension, he attributes his elevated blood pressures to work-related stress, I would like to see him back on Thursday next week, he will have been off work for 3 days, and then we can recheck, if still elevated we would start some lisinopril.

## 2014-06-29 ENCOUNTER — Ambulatory Visit (INDEPENDENT_AMBULATORY_CARE_PROVIDER_SITE_OTHER): Payer: BC Managed Care – PPO | Admitting: Sports Medicine

## 2014-06-29 ENCOUNTER — Encounter: Payer: Self-pay | Admitting: Sports Medicine

## 2014-06-29 VITALS — BP 154/92 | HR 74 | Wt 168.0 lb

## 2014-06-29 DIAGNOSIS — I159 Secondary hypertension, unspecified: Secondary | ICD-10-CM

## 2014-06-29 DIAGNOSIS — I10 Essential (primary) hypertension: Secondary | ICD-10-CM

## 2014-06-29 MED ORDER — LISINOPRIL 20 MG PO TABS: 20.0000 mg | ORAL_TABLET | Freq: Every day | ORAL | Status: DC

## 2014-06-29 NOTE — Progress Notes (Signed)
  Subjective:    CC: Followup  HPI: Elevated blood pressure: Continues to be elevated, he told me he wanted to check his blood pressure after being off of work for 3 days, unfortunately blood pressure continues to be elevated and he has episodes where he gets headaches, feels as though adrenaline is pumping through his veins. He denies any chest pain, palpitations, sweating. He tells me he does have significant heat intolerance.  Past medical history, Surgical history, Family history not pertinant except as noted below, Social history, Allergies, and medications have been entered into the medical record, reviewed, and no changes needed.   Review of Systems: No fevers, chills, night sweats, weight loss, chest pain, or shortness of breath.   Objective:    General: Well Developed, well nourished, and in no acute distress.  Neuro: Alert and oriented x3, extra-ocular muscles intact, sensation grossly intact.  HEENT: Normocephalic, atraumatic, pupils equal round reactive to light, neck supple, no masses, no lymphadenopathy, thyroid nonpalpable.  Skin: Warm and dry, no rashes. Cardiac: Regular rate and rhythm, no murmurs rubs or gallops, no lower extremity edema.  Respiratory: Clear to auscultation bilaterally. Not using accessory muscles, speaking in full sentences.  Impression and Recommendations:

## 2014-06-29 NOTE — Assessment & Plan Note (Signed)
At this point blood pressure continues to be elevated despite 3 days off of work. This no longer represents white coat hypertension, we are starting lisinopril 20. I'm also going to do testing for secondary causes of hypertension as he has mentioned episodes where his blood pressure spikes, and he feels as if adrenaline is pumping thru his veins. We are checking for pheochromocytoma.

## 2014-06-30 LAB — COMPREHENSIVE METABOLIC PANEL
ALT: 40 U/L (ref 0–53)
Albumin: 4.3 g/dL (ref 3.5–5.2)
CO2: 26 mEq/L (ref 19–32)
Calcium: 9.3 mg/dL (ref 8.4–10.5)
Chloride: 101 mEq/L (ref 96–112)
Potassium: 4 mEq/L (ref 3.5–5.3)
Sodium: 138 mEq/L (ref 135–145)
Total Protein: 6.7 g/dL (ref 6.0–8.3)

## 2014-06-30 LAB — COMPREHENSIVE METABOLIC PANEL WITH GFR
AST: 25 U/L (ref 0–37)
Alkaline Phosphatase: 91 U/L (ref 39–117)
BUN: 10 mg/dL (ref 6–23)
Creat: 1.12 mg/dL (ref 0.50–1.35)
Glucose, Bld: 95 mg/dL (ref 70–99)
Total Bilirubin: 0.5 mg/dL (ref 0.2–1.2)

## 2014-06-30 LAB — CBC
HCT: 44 % (ref 39.0–52.0)
Hemoglobin: 15.9 g/dL (ref 13.0–17.0)
MCH: 29.3 pg (ref 26.0–34.0)
MCHC: 36.1 g/dL — ABNORMAL HIGH (ref 30.0–36.0)
MCV: 81.2 fL (ref 78.0–100.0)
Platelets: 245 10*3/uL (ref 150–400)
RBC: 5.42 MIL/uL (ref 4.22–5.81)
RDW: 13.1 % (ref 11.5–15.5)
WBC: 8.2 10*3/uL (ref 4.0–10.5)

## 2014-06-30 LAB — METANEPHRINES, URINE, 24 HOUR

## 2014-06-30 LAB — TSH: TSH: 1.149 u[IU]/mL (ref 0.350–4.500)

## 2014-06-30 LAB — ANGIOTENSIN CONVERTING ENZYME: Angiotensin-Converting Enzyme: 29 U/L (ref 8–52)

## 2014-06-30 LAB — 5 HIAA, QUANTITATIVE, URINE, 24 HOUR

## 2014-06-30 LAB — TESTOSTERONE: Testosterone: 219 ng/dL — ABNORMAL LOW (ref 300–890)

## 2014-07-04 ENCOUNTER — Other Ambulatory Visit: Payer: Self-pay | Admitting: Sports Medicine

## 2014-07-04 LAB — CORTISOL, FREE: Cortisol Free, Ser: 0.09 ug/dL

## 2014-07-08 LAB — METANEPHRINES, URINE, 24 HOUR
Metaneph Total, Ur: 434 mcg/24 h (ref 182–739)
Metanephrines, Ur: 128 mcg/24 h (ref 58–203)
Normetanephrine, 24H Ur: 306 mcg/24 h (ref 88–649)

## 2014-07-08 LAB — 5 HIAA, QUANTITATIVE, URINE, 24 HOUR: 5-HIAA, 24 Hr Urine: 2.1 mg/24 h (ref <=6.0)

## 2014-07-21 ENCOUNTER — Ambulatory Visit (INDEPENDENT_AMBULATORY_CARE_PROVIDER_SITE_OTHER): Payer: BC Managed Care – PPO | Admitting: Sports Medicine

## 2014-07-21 ENCOUNTER — Encounter: Payer: Self-pay | Admitting: Sports Medicine

## 2014-07-21 VITALS — BP 134/86 | HR 65 | Ht 68.0 in | Wt 166.0 lb

## 2014-07-21 DIAGNOSIS — I10 Essential (primary) hypertension: Secondary | ICD-10-CM

## 2014-07-21 NOTE — Progress Notes (Signed)
  Subjective:    CC: Followup  HPI: Hypertension: Well controlled now on lisinopril 20, he has a completely negative workup for secondary causes of hypertension. He is developing a mild scratchy throat but no dry cough.  Past medical history, Surgical history, Family history not pertinant except as noted below, Social history, Allergies, and medications have been entered into the medical record, reviewed, and no changes needed.   Review of Systems: No fevers, chills, night sweats, weight loss, chest pain, or shortness of breath.   Objective:    General: Well Developed, well nourished, and in no acute distress.  Neuro: Alert and oriented x3, extra-ocular muscles intact, sensation grossly intact.  HEENT: Normocephalic, atraumatic, pupils equal round reactive to light, neck supple, no masses, no lymphadenopathy, thyroid nonpalpable.  Skin: Warm and dry, no rashes. Cardiac: Regular rate and rhythm, no murmurs rubs or gallops, no lower extremity edema.  Respiratory: Clear to auscultation bilaterally. Not using accessory muscles, speaking in full sentences.  Impression and Recommendations:

## 2014-07-21 NOTE — Assessment & Plan Note (Signed)
Doing well on 20 mg of lisinopril. He does not have a dry cough but doesn't worsen occasional itchy, scratchy throat. If this does not go away we can switch him to an ARB.

## 2014-07-21 NOTE — Patient Instructions (Signed)
Lisinopril tablets What is this medicine? LISINOPRIL (lyse IN oh pril) is an ACE inhibitor. This medicine is used to treat high blood pressure and heart failure. It is also used to protect the heart immediately after a heart attack. This medicine may be used for other purposes; ask your health care provider or pharmacist if you have questions. COMMON BRAND NAME(S): Prinivil, Zestril What should I tell my health care provider before I take this medicine? They need to know if you have any of these conditions: -diabetes -heart or blood vessel disease -immune system disease like lupus or scleroderma -kidney disease -low blood pressure -previous swelling of the tongue, face, or lips with difficulty breathing, difficulty swallowing, hoarseness, or tightening of the throat -an unusual or allergic reaction to lisinopril, other ACE inhibitors, insect venom, foods, dyes, or preservatives -pregnant or trying to get pregnant -breast-feeding How should I use this medicine? Take this medicine by mouth with a glass of water. Follow the directions on your prescription label. You may take this medicine with or without food. Take your medicine at regular intervals. Do not stop taking this medicine except on the advice of your doctor or health care professional. Talk to your pediatrician regarding the use of this medicine in children. Special care may be needed. While this drug may be prescribed for children as young as 6 years of age for selected conditions, precautions do apply. Overdosage: If you think you have taken too much of this medicine contact a poison control center or emergency room at once. NOTE: This medicine is only for you. Do not share this medicine with others. What if I miss a dose? If you miss a dose, take it as soon as you can. If it is almost time for your next dose, take only that dose. Do not take double or extra doses. What may interact with this medicine? -diuretics -lithium -NSAIDs,  medicines for pain and inflammation, like ibuprofen or naproxen -over-the-counter herbal supplements like hawthorn -potassium salts or potassium supplements -salt substitutes This list may not describe all possible interactions. Give your health care provider a list of all the medicines, herbs, non-prescription drugs, or dietary supplements you use. Also tell them if you smoke, drink alcohol, or use illegal drugs. Some items may interact with your medicine. What should I watch for while using this medicine? Visit your doctor or health care professional for regular check ups. Check your blood pressure as directed. Ask your doctor what your blood pressure should be, and when you should contact him or her. Call your doctor or health care professional if you notice an irregular or fast heart beat. Women should inform their doctor if they wish to become pregnant or think they might be pregnant. There is a potential for serious side effects to an unborn child. Talk to your health care professional or pharmacist for more information. Check with your doctor or health care professional if you get an attack of severe diarrhea, nausea and vomiting, or if you sweat a lot. The loss of too much body fluid can make it dangerous for you to take this medicine. You may get drowsy or dizzy. Do not drive, use machinery, or do anything that needs mental alertness until you know how this drug affects you. Do not stand or sit up quickly, especially if you are an older patient. This reduces the risk of dizzy or fainting spells. Alcohol can make you more drowsy and dizzy. Avoid alcoholic drinks. Avoid salt substitutes unless you are told   otherwise by your doctor or health care professional. Do not treat yourself for coughs, colds, or pain while you are taking this medicine without asking your doctor or health care professional for advice. Some ingredients may increase your blood pressure. What side effects may I notice from  receiving this medicine? Side effects that you should report to your doctor or health care professional as soon as possible: -abdominal pain with or without nausea or vomiting -allergic reactions like skin rash or hives, swelling of the hands, feet, face, lips, throat, or tongue -dark urine -difficulty breathing -dizzy, lightheaded or fainting spell -fever or sore throat -irregular heart beat, chest pain -pain or difficulty passing urine -redness, blistering, peeling or loosening of the skin, including inside the mouth -unusually weak -yellowing of the eyes or skin Side effects that usually do not require medical attention (report to your doctor or health care professional if they continue or are bothersome): -change in taste -cough -decreased sexual function or desire -headache -sun sensitivity -tiredness This list may not describe all possible side effects. Call your doctor for medical advice about side effects. You may report side effects to FDA at 1-800-FDA-1088. Where should I keep my medicine? Keep out of the reach of children. Store at room temperature between 15 and 30 degrees C (59 and 86 degrees F). Protect from moisture. Keep container tightly closed. Throw away any unused medicine after the expiration date. NOTE: This sheet is a summary. It may not cover all possible information. If you have questions about this medicine, talk to your doctor, pharmacist, or health care provider.  2015, [employer]vier/[employer] Standard. (2008-05-08 17:36:32)  

## 2014-08-30 ENCOUNTER — Telehealth: Payer: Self-pay

## 2014-08-30 DIAGNOSIS — G4719 Other hypersomnia: Secondary | ICD-10-CM

## 2014-08-30 NOTE — Telephone Encounter (Signed)
Patient called stated that he was in the office on 07/21/14 and was prescribed Lisinopril, patient stated that it is not working for him and he would like to try another medication. Patient also said during his visit a sleep study was discussed so he is requesting a referral for a sleep study. [place] [facility] [place] Rhonda Cunningham,CMA

## 2014-08-31 ENCOUNTER — Telehealth: Payer: Self-pay | Admitting: Sports Medicine

## 2014-08-31 ENCOUNTER — Other Ambulatory Visit: Payer: Self-pay | Admitting: Sports Medicine

## 2014-08-31 MED ORDER — VALSARTAN 320 MG PO TABS: 160.0000 mg | ORAL_TABLET | Freq: Every day | ORAL | Status: DC

## 2014-08-31 NOTE — Telephone Encounter (Signed)
OK, we will switch him to an ARB. One half tab daily. Return in 2-3 weeks to recheck blood pressure. Orders also placed for sleep study.

## 2014-08-31 NOTE — Telephone Encounter (Signed)
Left detailed message on patient home vm with instructions as noted below. Rhonda Cunningham,CMA

## 2014-08-31 NOTE — Addendum Note (Signed)
Addended by: Monica BectonHEKKEKANDAM, Yossi Hinchman J on: 08/31/2014 04:59 PM   Modules accepted: Orders

## 2014-09-04 ENCOUNTER — Telehealth: Payer: Self-pay

## 2014-09-04 DIAGNOSIS — Z8249 Family history of ischemic heart disease and other diseases of the circulatory system: Secondary | ICD-10-CM

## 2014-09-04 DIAGNOSIS — R0789 Other chest pain: Secondary | ICD-10-CM

## 2014-09-04 DIAGNOSIS — IMO0001 Reserved for inherently not codable concepts without codable children: Secondary | ICD-10-CM

## 2014-09-04 DIAGNOSIS — R42 Dizziness and giddiness: Secondary | ICD-10-CM

## 2014-09-04 DIAGNOSIS — R030 Elevated blood-pressure reading, without diagnosis of hypertension: Secondary | ICD-10-CM

## 2014-09-04 NOTE — Telephone Encounter (Signed)
Colin SporeWesley on Thursday night he had chest tightness, abdominal pain, lightheadedness and increased blood pressure. [facility] Thursday night early Friday morning and discharged with a diagnosis of Hypertension. He still has episode of lightheadedness, elevated blood pressure and chest tightness. He as a positive family history of heart attack. He as been scheduled today with Everardo AllJohn C Power, MD at 2:20 - [address] [place], [place]. If this referral is appropriate please sign.

## 2014-09-04 NOTE — Telephone Encounter (Signed)
Referral has been signed, he should continue to take blood pressure medication prescribed. I'm happy to see him anytime to make to tweaks in his blood pressure regimen.

## 2014-09-04 NOTE — Telephone Encounter (Signed)
I called Mr. Colin Carter back about msg on 10/15 but he does not remember his msg

## 2014-09-04 NOTE — Telephone Encounter (Signed)
Left a message advising patient to continue taking his blood pressure medication as directed.

## 2014-09-04 NOTE — Telephone Encounter (Signed)
Patient advised to continue to take blood pressure medication and to monitor his blood pressure.

## 2014-09-05 ENCOUNTER — Ambulatory Visit: Payer: BC Managed Care – PPO | Admitting: Cardiovascular Disease

## 2014-09-12 ENCOUNTER — Encounter: Payer: Self-pay | Admitting: Sports Medicine

## 2014-09-12 ENCOUNTER — Ambulatory Visit (INDEPENDENT_AMBULATORY_CARE_PROVIDER_SITE_OTHER): Payer: BC Managed Care – PPO | Admitting: Sports Medicine

## 2014-09-12 VITALS — BP 125/82 | HR 68 | Ht 68.0 in | Wt 161.0 lb

## 2014-09-12 DIAGNOSIS — M47816 Spondylosis without myelopathy or radiculopathy, lumbar region: Secondary | ICD-10-CM | POA: Diagnosis not present

## 2014-09-12 DIAGNOSIS — I10 Essential (primary) hypertension: Secondary | ICD-10-CM

## 2014-09-12 NOTE — Assessment & Plan Note (Signed)
Overall well controlled with using a Diovan one third pill approximately 3 times per week during shifts.

## 2014-09-12 NOTE — Assessment & Plan Note (Addendum)
Left-sided L5-S1 facet arthritis and L5-S1 degenerative disc disease. At this point we are going to start conservatively, he will do an anti-inflammatory for his shifts, we will also write a letter to try and get him into 8 hour shifts and time off between shifts. If this does not work, we can certainly consider interventional treatment.

## 2014-09-12 NOTE — Progress Notes (Signed)
  Subjective:    CC: follow-up  HPI: Hypertension: Well controlled on valsartan one third of a tablet 3 times per week.  Lumbar spondylosis: With left-sided facet arthritis and L5-S1 degenerative disc disease, gets pain with prolonged standing for 12 hour shifts. Has not yet tried Mobic. He agrees to try Mobic, and he would like some restrictions for work.  Past medical history, Surgical history, Family history not pertinant except as noted below, Social history, Allergies, and medications have been entered into the medical record, reviewed, and no changes needed.   Review of Systems: No fevers, chills, night sweats, weight loss, chest pain, or shortness of breath.   Objective:    General: Well Developed, well nourished, and in no acute distress.  Neuro: Alert and oriented x3, extra-ocular muscles intact, sensation grossly intact.  HEENT: Normocephalic, atraumatic, pupils equal round reactive to light, neck supple, no masses, no lymphadenopathy, thyroid nonpalpable.  Skin: Warm and dry, no rashes. Cardiac: Regular rate and rhythm, no murmurs rubs or gallops, no lower extremity edema.  Respiratory: Clear to auscultation bilaterally. Not using accessory muscles, speaking in full sentences.  Impression and Recommendations:   I spent 25 minutes with this patient, greater than 50% was face-to-face time counseling regarding the above diagnoses.

## 2014-09-19 ENCOUNTER — Ambulatory Visit (INDEPENDENT_AMBULATORY_CARE_PROVIDER_SITE_OTHER): Payer: BC Managed Care – PPO | Admitting: Sports Medicine

## 2014-09-19 ENCOUNTER — Encounter: Payer: Self-pay | Admitting: Sports Medicine

## 2014-09-19 VITALS — BP 123/76 | HR 62 | Ht 68.0 in | Wt 163.0 lb

## 2014-09-19 DIAGNOSIS — I10 Essential (primary) hypertension: Secondary | ICD-10-CM | POA: Diagnosis not present

## 2014-09-19 DIAGNOSIS — F411 Generalized anxiety disorder: Secondary | ICD-10-CM | POA: Diagnosis not present

## 2014-09-19 DIAGNOSIS — M47816 Spondylosis without myelopathy or radiculopathy, lumbar region: Secondary | ICD-10-CM

## 2014-09-19 MED ORDER — CITALOPRAM HYDROBROMIDE 20 MG PO TABS: 20.0000 mg | ORAL_TABLET | Freq: Every day | ORAL | Status: DC

## 2014-09-19 NOTE — Assessment & Plan Note (Signed)
Starting citalopram. Referral to psychiatry.

## 2014-09-19 NOTE — Progress Notes (Signed)
  Subjective:    CC: follow-up  HPI: Gerri SporeWesley needs his short-term disability forms filled out. He has lumbar spondylosis with L5-S1 degenerative disc disease. He has failed physical therapy, steroids, NSAIDs, he is amenable to trying an injection. Symptoms are moderate, persistent.  Past medical history, Surgical history, Family history not pertinant except as noted below, Social history, Allergies, and medications have been entered into the medical record, reviewed, and no changes needed.   Review of Systems: No fevers, chills, night sweats, weight loss, chest pain, or shortness of breath.   Objective:    General: Well Developed, well nourished, and in no acute distress.  Neuro: Alert and oriented x3, extra-ocular muscles intact, sensation grossly intact.  HEENT: Normocephalic, atraumatic, pupils equal round reactive to light, neck supple, no masses, no lymphadenopathy, thyroid nonpalpable.  Skin: Warm and dry, no rashes. Cardiac: Regular rate and rhythm, no murmurs rubs or gallops, no lower extremity edema.  Respiratory: Clear to auscultation bilaterally. Not using accessory muscles, speaking in full sentences.  Disability forms were filled out.  Impression and Recommendations:    I spent 40 minutes with this patient, greater than 50% was face-to-face time counseling regarding the above diagnoses.

## 2014-09-19 NOTE — Assessment & Plan Note (Signed)
Partial disability paperwork filled out today. Left L5-S1 interlaminar epidural ordered. Return to see me 2 weeks after her epidural.

## 2014-09-19 NOTE — Assessment & Plan Note (Signed)
Well controlled, no changes 

## 2014-09-27 ENCOUNTER — Encounter: Payer: Self-pay | Admitting: Sports Medicine

## 2014-09-27 ENCOUNTER — Other Ambulatory Visit: Payer: BC Managed Care – PPO

## 2014-09-28 ENCOUNTER — Encounter: Payer: Self-pay | Admitting: Sports Medicine

## 2014-09-28 ENCOUNTER — Ambulatory Visit (INDEPENDENT_AMBULATORY_CARE_PROVIDER_SITE_OTHER): Payer: BC Managed Care – PPO | Admitting: Sports Medicine

## 2014-09-28 VITALS — BP 117/74 | HR 57 | Ht 68.0 in | Wt 163.0 lb

## 2014-09-28 DIAGNOSIS — M47816 Spondylosis without myelopathy or radiculopathy, lumbar region: Secondary | ICD-10-CM

## 2014-09-28 DIAGNOSIS — Z0289 Encounter for other administrative examinations: Secondary | ICD-10-CM

## 2014-09-28 NOTE — Progress Notes (Signed)
  Subjective:    CC: follow-up  HPI: Right lumbar radiculopathy: Persistent symptoms, has not yet gotten his epidural, tells me he would like to postpone this. He does need me to fill out more disability paperwork, and would like to return to work without restrictions.  Past medical history, Surgical history, Family history not pertinant except as noted below, Social history, Allergies, and medications have been entered into the medical record, reviewed, and no changes needed.   Review of Systems: No fevers, chills, night sweats, weight loss, chest pain, or shortness of breath.   Objective:    General: Well Developed, well nourished, and in no acute distress.  Neuro: Alert and oriented x3, extra-ocular muscles intact, sensation grossly intact.  HEENT: Normocephalic, atraumatic, pupils equal round reactive to light, neck supple, no masses, no lymphadenopathy, thyroid nonpalpable.  Skin: Warm and dry, no rashes. Cardiac: Regular rate and rhythm, no murmurs rubs or gallops, no lower extremity edema.  Respiratory: Clear to auscultation bilaterally. Not using accessory muscles, speaking in full sentences.  Disability paperwork filled out. We will need to fax the paperwork and all of my records.  Impression and Recommendations:

## 2014-09-28 NOTE — Assessment & Plan Note (Signed)
Has not yet gotten lumbar epidural. Short-term disability paperwork filled out today, he will be going back to work. We will fax all of his office notes and MRI results to his disability company.

## 2014-10-02 ENCOUNTER — Telehealth: Payer: Self-pay

## 2014-10-02 NOTE — Telephone Encounter (Signed)
Colin SporeWesley complains of confusion off and on for a month. He states he has not felt the same since his ED visit a month ago. He has not started citalopram medication. He took 3 weeks off of work to see if that would help. This has not resolved his moments of confusion. He forgets simple words. He has been checking his blood pressure and he noticed his pulse has been low. He would like Dr Benjamin Stainhekkekandam to review and give recommendations about his next step.     Blood pressure and pulse readings.   122/75 pulse 66 127/76 pulse 50 124/74 pulse 54 126/74 pulse 49 127/73 pulse 51 118/81 pulse 48

## 2014-10-02 NOTE — Telephone Encounter (Signed)
These vital signs are normal, I would like him to follow-up with psychiatry as previously recommended. He should also start the citalopram as recommended.

## 2014-10-03 NOTE — Telephone Encounter (Signed)
Patient advised of recommendations.  

## 2014-10-10 ENCOUNTER — Ambulatory Visit: Payer: BC Managed Care – PPO | Admitting: Sports Medicine

## 2014-10-16 ENCOUNTER — Telehealth: Payer: Self-pay | Admitting: *Deleted

## 2014-10-16 NOTE — Telephone Encounter (Signed)
Noted  

## 2014-10-16 NOTE — Telephone Encounter (Signed)
Colin Carter called this afternoon stating that he was experiencing general chest discomfort in this middle and left side of his chest. He denied headache or claminess but said that his BP was reading 162/107. The only other complaint that he was voicing was generalized body pain.I told him that he needed to be evaulated since this was a sudden onset and he verbalized understanding. Colin SkainsJamie Jahiem Carter, CMA

## 2014-10-18 ENCOUNTER — Telehealth: Payer: Self-pay | Admitting: *Deleted

## 2014-10-18 DIAGNOSIS — I10 Essential (primary) hypertension: Secondary | ICD-10-CM

## 2014-10-18 NOTE — Telephone Encounter (Signed)
That does not sound like a blockage in the kidneys, however that is his concern could certainly get renal artery imaging without referral to a nephrologist, certainly we can also try checking urinary metanephrines. Let me know if he wants to do this?

## 2014-10-18 NOTE — Telephone Encounter (Signed)
[facility] called and is asking if he can have a referral to a nephrologist. He feels that his intermittent hypertension is caused from a blockage in his kidney(s). He has a friend that is a doctor and he consulted with him on this but [place]. I asked Gerri [facility] if he had any other problems and he denied all symptoms and actually said that he has stopped taking his BP medicine and it is controlled at 120/72 on a regular basis other than the occasional spikes and then he feels really bad. Please advise. Corliss SkainsJamie Shekelia Boutin, CMA

## 2014-10-19 NOTE — Telephone Encounter (Signed)
Colin [facility] agrees to having the test and imaging.

## 2014-10-19 NOTE — Telephone Encounter (Signed)
Orders placed for renal artery Dopplers, with episodic hypertension, 24 urine testing is typically done for pheochromocytoma and carcinoid syndrome, we already did this testing and it was negative. We will await results for renal artery Dopplers. I do expect them to be negative.

## 2014-10-20 NOTE — Telephone Encounter (Signed)
Patient is aware someone will call to schedule him for the ultrasound.

## 2015-01-23 ENCOUNTER — Ambulatory Visit: Payer: BC Managed Care – PPO | Admitting: Sports Medicine

## 2015-03-15 ENCOUNTER — Other Ambulatory Visit: Payer: Self-pay | Admitting: *Deleted

## 2015-03-15 DIAGNOSIS — I10 Essential (primary) hypertension: Secondary | ICD-10-CM

## 2015-03-15 MED ORDER — VALSARTAN 320 MG PO TABS: ORAL_TABLET | ORAL | Status: DC

## 2015-03-19 ENCOUNTER — Other Ambulatory Visit: Payer: Self-pay | Admitting: Sports Medicine

## 2015-03-19 DIAGNOSIS — I10 Essential (primary) hypertension: Secondary | ICD-10-CM

## 2015-03-19 MED ORDER — VALSARTAN 80 MG PO TABS: 80.0000 mg | ORAL_TABLET | ORAL | Status: DC

## 2015-03-30 ENCOUNTER — Ambulatory Visit: Payer: Self-pay | Admitting: Sports Medicine

## 2015-04-17 ENCOUNTER — Telehealth: Payer: Self-pay | Admitting: *Deleted

## 2015-04-17 MED ORDER — VALSARTAN 320 MG PO TABS: ORAL_TABLET | ORAL | Status: DC

## 2015-04-17 NOTE — Telephone Encounter (Signed)
He was on a very odd dosing regimen, one third of the 320 mg tablet 3 times per week, I will call again as exactly this regimen.

## 2015-04-17 NOTE — Telephone Encounter (Signed)
Pt came into the office today questioning his valsartan rx.  It's for 80mg  when all his previous rx's have been for 320mg .  [facility] addition[facility] of the 80mg .  Now, to clarify, should he be doing the 80mg  DAILY or the .5 tab of the 320mg ?  Please advise so I can call [facility] and give them the business.

## 2015-04-18 ENCOUNTER — Other Ambulatory Visit: Payer: Self-pay | Admitting: *Deleted

## 2015-04-18 MED ORDER — VALSARTAN 80 MG PO TABS: 80.0000 mg | ORAL_TABLET | Freq: Every day | ORAL | Status: DC

## 2015-04-18 NOTE — Telephone Encounter (Signed)
Okay, this is getting complicated 80 mg daily. No changes. LOL.

## 2015-04-18 NOTE — Telephone Encounter (Signed)
Preesh!  You is ozzummm.Marland Kitchen.Marland Kitchen.!

## 2015-04-18 NOTE — Telephone Encounter (Signed)
I've already taken care of it.  Spoke to him and also called it in to [facility].  BOOM

## 2015-04-18 NOTE — Telephone Encounter (Signed)
He told me yesterday that he was taking .5 tab daily.  He said that he was ok with the 80mg  but I was just double checking what you wanted him to take.

## 2015-05-17 ENCOUNTER — Telehealth: Payer: Self-pay

## 2015-05-17 NOTE — Telephone Encounter (Signed)
So, small changes, lets go ahead and bump it up to 160 mg daily, he can use 2 of the 80 mg pills daily until he runs out and then we can refill at 160, please tell him thank you for keeping me updated on the blood pressure readings, and to keep this up..Marland Kitchen

## 2015-05-17 NOTE — Telephone Encounter (Signed)
Patient states the Diovan was changed from 320 mg half a tablet daily to 80 mg once daily. He states this is not helping with his blood pressure. His readings have been around 150/90's. [facility] uses [facility].

## 2015-05-18 NOTE — Telephone Encounter (Signed)
Patient advised of recommendations.  

## 2015-06-05 ENCOUNTER — Encounter: Payer: Self-pay | Admitting: Sports Medicine

## 2015-06-05 ENCOUNTER — Ambulatory Visit (INDEPENDENT_AMBULATORY_CARE_PROVIDER_SITE_OTHER): Payer: BLUE CROSS/BLUE SHIELD | Admitting: Sports Medicine

## 2015-06-05 VITALS — BP 119/76 | HR 56 | Ht 68.0 in | Wt 166.0 lb

## 2015-06-05 DIAGNOSIS — M722 Plantar fascial fibromatosis: Secondary | ICD-10-CM | POA: Diagnosis not present

## 2015-06-05 DIAGNOSIS — F411 Generalized anxiety disorder: Secondary | ICD-10-CM

## 2015-06-05 DIAGNOSIS — Z0000 Encounter for general adult medical examination without abnormal findings: Secondary | ICD-10-CM | POA: Diagnosis not present

## 2015-06-05 DIAGNOSIS — I10 Essential (primary) hypertension: Secondary | ICD-10-CM

## 2015-06-05 MED ORDER — AMLODIPINE BESYLATE 5 MG PO TABS: 5.0000 mg | ORAL_TABLET | Freq: Every day | ORAL | Status: DC

## 2015-06-05 NOTE — Assessment & Plan Note (Signed)
Stable and controlled, he does have episodes of chest pain, panic, as part of this could be related to diffuse esophageal spasm we will switch his blood pressure medicine from valsartan to amlodipine. Return in one month. He has not yet had his renal Dopplers. We will reorder this.

## 2015-06-05 NOTE — Assessment & Plan Note (Signed)
Return for custom orthotics, rehabilitation exercises given.

## 2015-06-05 NOTE — Progress Notes (Signed)
  Subjective:    CC:  Complete physical   HPI:  Physical exam: overall doing well from a health standpoint.  Chest pain: has had a negative stress echo, gets intolerable episodes of anxiety, stress, palpitations, with chest pain. Also gets mid back and abdominal pain. He has been on summarized, Xanax, Wellbutrin, BuSpar and the past,  He is adamant that this does not represent a panic attack. He did try a  Xanax that seemed to resolve very quickly. No depression, no suicidal or homicidal ideation.  Hypertension: Well controlled on valsartan.  Plantar fasciitis: has been treated by another practice, desires that we take over, has had injections but has never had rehabilitation or custom orthotics.  Past medical history, Surgical history, Family history not pertinant except as noted below, Social history, Allergies, and medications have been entered into the medical record, reviewed, and no changes needed.   Review of Systems: No headache, visual changes, nausea, vomiting, diarrhea, constipation, dizziness, abdominal pain, skin rash, fevers, chills, night sweats, swollen lymph nodes, weight loss, chest pain, body aches, joint swelling, muscle aches, shortness of breath, mood changes, visual or auditory hallucinations.  Objective:    General: Well Developed, well nourished, and in no acute distress.  Neuro: Alert and oriented x3, extra-ocular muscles intact, sensation grossly intact. Cranial nerves II through XII are intact, motor, sensory, and coordinative functions are all intact. HEENT: Normocephalic, atraumatic, pupils equal round reactive to light, neck supple, no masses, no lymphadenopathy, thyroid nonpalpable. Oropharynx, nasopharynx, external ear canals are unremarkable. Skin: Warm and dry, no rashes noted.  Cardiac: Regular rate and rhythm, no murmurs rubs or gallops.  Respiratory: Clear to auscultation bilaterally. Not using accessory muscles, speaking in full sentences.  Abdominal:  Soft, nontender, nondistended, positive bowel sounds, no masses, no organomegaly.  Musculoskeletal: Shoulder, elbow, wrist, hip, knee, ankle stable, and with full range of motion.  Impression and Recommendations:    The patient was counselled, risk factors were discussed, anticipatory guidance given.  I spent 40 minutes with this patient, greater than 50% was face-to-face time counseling regarding the above diagnoses, this was separate from the time spent doing the physical examination

## 2015-06-05 NOTE — Assessment & Plan Note (Signed)
Persistent episodes of panic attack with chest pain, palpitations, elevated blood pressure. He has discontinued Celexa, and has great difficult believing that his symptoms could be related to panic. He has had negative 5 HIAA and urinary 24-hour metanephrines, negative stress echo, declines any daily controller medication. He is using occasional Xanax which seems to help. He has already done psychotherapy.

## 2015-06-05 NOTE — Assessment & Plan Note (Signed)
Completely unremarkable physical. He continues to have significant difficulty with panic disorder.

## 2015-06-06 ENCOUNTER — Telehealth: Payer: Self-pay

## 2015-06-06 DIAGNOSIS — I10 Essential (primary) hypertension: Secondary | ICD-10-CM

## 2015-06-06 NOTE — Telephone Encounter (Signed)
Patient would like to go back on Valsartan 160 mg once daily. He does not want to take amlodipine. If you switch him back to the Valsartan he will need a [facility].

## 2015-06-07 ENCOUNTER — Other Ambulatory Visit: Payer: Self-pay | Admitting: Sports Medicine

## 2015-06-07 NOTE — Telephone Encounter (Signed)
Why not?  We were trying to treat other processes as well with switching to amlodipine.  This puts [place] back at square 1, as if we didn't even have the last visit! At this point he needs to make up his mind and then stick to it.

## 2015-06-07 NOTE — Telephone Encounter (Signed)
Error

## 2015-06-07 NOTE — Telephone Encounter (Signed)
Patient states the valsartan works well and he would like to stay with what works. He states he didn't have a conversation at the office visit about switching his valsartan to amlodipine. He states he was given the prescription and told to try it.

## 2015-06-08 MED ORDER — VALSARTAN 160 MG PO TABS: 160.0000 mg | ORAL_TABLET | Freq: Every day | ORAL | Status: DC

## 2015-06-08 NOTE — Telephone Encounter (Signed)
Per request, switching back to valsartan 160

## 2015-06-21 ENCOUNTER — Encounter: Payer: Self-pay | Admitting: Sports Medicine

## 2015-06-21 ENCOUNTER — Ambulatory Visit (INDEPENDENT_AMBULATORY_CARE_PROVIDER_SITE_OTHER): Payer: BLUE CROSS/BLUE SHIELD | Admitting: Sports Medicine

## 2015-06-21 VITALS — BP 116/76 | HR 56 | Wt 164.0 lb

## 2015-06-21 DIAGNOSIS — M722 Plantar fascial fibromatosis: Secondary | ICD-10-CM

## 2015-06-21 NOTE — Assessment & Plan Note (Signed)
Custom orthotics as above. Plantar pain has improved significantly, pain is predominantly at the mid Achilles as well as tibialis posterior. Orthotic should be tremendously helpful for this as well.  Return in one month.

## 2015-06-21 NOTE — Progress Notes (Signed)

## 2015-09-25 ENCOUNTER — Telehealth: Payer: Self-pay

## 2015-09-25 MED ORDER — ALPRAZOLAM 0.5 MG PO TBDP: 0.5000 mg | ORAL_TABLET | Freq: Two times a day (BID) | ORAL | Status: DC | PRN

## 2015-09-25 NOTE — Telephone Encounter (Signed)
Pt states he received alprazolam 0.5 mg 1-2 tabs BID PRN and he would like to see if you can fill this for him. Please advise.

## 2015-09-25 NOTE — Telephone Encounter (Signed)
Rx in box. 

## 2015-12-24 ENCOUNTER — Telehealth: Payer: Self-pay

## 2015-12-24 MED ORDER — LISINOPRIL 20 MG PO TABS: 20.0000 mg | ORAL_TABLET | Freq: Every day | ORAL | Status: DC

## 2015-12-24 NOTE — Telephone Encounter (Signed)
Unlikely, but Lisinopril  daily called in.

## 2015-12-24 NOTE — Telephone Encounter (Signed)
Left message advising of recommendations.  

## 2015-12-24 NOTE — Telephone Encounter (Signed)
Patient states he was having ankle and wrist pain and lower back pain. He states it was because of the Valsartan. He wants to switch back to the lisinopril. Please advise.

## 2016-06-04 ENCOUNTER — Ambulatory Visit (INDEPENDENT_AMBULATORY_CARE_PROVIDER_SITE_OTHER): Payer: BLUE CROSS/BLUE SHIELD | Admitting: Sports Medicine

## 2016-06-04 ENCOUNTER — Encounter: Payer: Self-pay | Admitting: Sports Medicine

## 2016-06-04 VITALS — BP 101/63 | HR 71 | Resp 16 | Wt 165.8 lb

## 2016-06-04 DIAGNOSIS — M722 Plantar fascial fibromatosis: Secondary | ICD-10-CM

## 2016-06-04 DIAGNOSIS — R8290 Unspecified abnormal findings in urine: Secondary | ICD-10-CM | POA: Insufficient documentation

## 2016-06-04 DIAGNOSIS — Z0000 Encounter for general adult medical examination without abnormal findings: Secondary | ICD-10-CM | POA: Diagnosis not present

## 2016-06-04 DIAGNOSIS — E781 Pure hyperglyceridemia: Secondary | ICD-10-CM

## 2016-06-04 DIAGNOSIS — I10 Essential (primary) hypertension: Secondary | ICD-10-CM

## 2016-06-04 DIAGNOSIS — F411 Generalized anxiety disorder: Secondary | ICD-10-CM

## 2016-06-04 LAB — CBC
HCT: 48.3 % (ref 38.5–50.0)
Hemoglobin: 16.4 g/dL (ref 13.2–17.1)
MCH: 29.2 pg (ref 27.0–33.0)
MCHC: 34 g/dL (ref 32.0–36.0)
MCV: 86.1 fL (ref 80.0–100.0)
MPV: 10.3 fL (ref 7.5–12.5)
Platelets: 214 10*3/uL (ref 140–400)
RBC: 5.61 MIL/uL (ref 4.20–5.80)
RDW: 13.1 % (ref 11.0–15.0)
WBC: 7.1 10*3/uL (ref 3.8–10.8)

## 2016-06-04 LAB — URINALYSIS
Bilirubin Urine: NEGATIVE
Glucose, UA: NEGATIVE
Hgb urine dipstick: NEGATIVE
Ketones, ur: NEGATIVE
Leukocytes, UA: NEGATIVE
Nitrite: NEGATIVE
Protein, ur: NEGATIVE
Specific Gravity, Urine: 1.017 (ref 1.001–1.035)
pH: 8 (ref 5.0–8.0)

## 2016-06-04 LAB — HEMOGLOBIN A1C
Hgb A1c MFr Bld: 5.1 % (ref <5.7)
Mean Plasma Glucose: 100 mg/dL

## 2016-06-04 LAB — TSH: TSH: 1.32 m[IU]/L (ref 0.40–4.50)

## 2016-06-04 NOTE — Assessment & Plan Note (Addendum)
Stable on lisinopril, checking routine blood work.

## 2016-06-04 NOTE — Assessment & Plan Note (Signed)
Annual physical as above.  

## 2016-06-04 NOTE — Assessment & Plan Note (Signed)
Recurrence of pain, has not had an injection in a year. Continue custom orthotics, adding gel cups.

## 2016-06-04 NOTE — Assessment & Plan Note (Addendum)
Certainly differential includes esophageal spasm, myofascial pain syndrome. I would like to start Cymbalta, he will think about it.

## 2016-06-04 NOTE — Progress Notes (Signed)
  Subjective:    CC:  Complete physical  HPI:  This is a pleasant 45 year old male, he is here for a physical and has multiple complaints.  Chest pain: Has had an extensive workup including stress echo, imaging, everything is been negative, symptoms have sound classically like anxiety. He takes an alprazolam and symptoms resolved but he continues to be somewhat in denial that this may represent anxiety.  Foot pain: Right-sided, localized at the retrocalcaneal bursa and a bit under the plantar fascia, much better with custom orthotics made years ago, does not have any gel cups. Symptoms are mild, persistent.  Foul-smelling urine:  Asymptomatic, no frequency, no urgency, no dysuria, no real pain, no constitutional symptoms.  Hypertension: Benign essential, well controlled.  Past medical history, Surgical history, Family history not pertinant except as noted below, Social history, Allergies, and medications have been entered into the medical record, reviewed, and no changes needed.   Review of Systems: No headache, visual changes, nausea, vomiting, diarrhea, constipation, dizziness, abdominal pain, skin rash, fevers, chills, night sweats, swollen lymph nodes, weight loss, chest pain, body aches, joint swelling, muscle aches, shortness of breath, mood changes, visual or auditory hallucinations.  Objective:    General: Well Developed, well nourished, and in no acute distress.  Neuro: Alert and oriented x3, extra-ocular muscles intact, sensation grossly intact. Cranial nerves II through XII are intact, motor, sensory, and coordinative functions are all intact. HEENT: Normocephalic, atraumatic, pupils equal round reactive to light, neck supple, no masses, no lymphadenopathy, thyroid nonpalpable. Oropharynx, nasopharynx, external ear canals are unremarkable. Skin: Warm and dry, no rashes noted.  Cardiac: Regular rate and rhythm, no murmurs rubs or gallops.  Respiratory: Clear to auscultation  bilaterally. Not using accessory muscles, speaking in full sentences.  Abdominal: Soft, nontender, nondistended, positive bowel sounds, no masses, no organomegaly.  Musculoskeletal: Shoulder, elbow, wrist, hip, knee, ankle stable, and with full range of motion.  Impression and Recommendations:    The patient was counselled, risk factors were discussed, anticipatory guidance given.  I spent 25 minutes with this patient, greater than 50% was face-to-face time counseling regarding the above diagnoses, this was separate from the time spent performing the physical examination.

## 2016-06-04 NOTE — Assessment & Plan Note (Signed)
Checking urinalysis, asymptomatic.

## 2016-06-05 DIAGNOSIS — E781 Pure hyperglyceridemia: Secondary | ICD-10-CM | POA: Insufficient documentation

## 2016-06-05 LAB — COMPREHENSIVE METABOLIC PANEL
BUN: 12 mg/dL (ref 7–25)
Calcium: 9.4 mg/dL (ref 8.6–10.3)
Chloride: 100 mmol/L (ref 98–110)
Creat: 1.11 mg/dL (ref 0.60–1.35)
Glucose, Bld: 102 mg/dL — ABNORMAL HIGH (ref 65–99)
Potassium: 4.3 mmol/L (ref 3.5–5.3)
Total Protein: 7.1 g/dL (ref 6.1–8.1)

## 2016-06-05 LAB — LIPID PANEL
Cholesterol: 199 mg/dL (ref 125–200)
HDL: 46 mg/dL (ref >=40)
LDL Cholesterol: 115 mg/dL (ref <130)
Total CHOL/HDL Ratio: 4.3 ratio (ref <=5.0)
Triglycerides: 191 mg/dL — ABNORMAL HIGH (ref <150)
VLDL: 38 mg/dL — ABNORMAL HIGH (ref <30)

## 2016-06-05 LAB — COMPREHENSIVE METABOLIC PANEL WITH GFR
ALT: 37 U/L (ref 9–46)
AST: 21 U/L (ref 10–40)
Albumin: 4.3 g/dL (ref 3.6–5.1)
Alkaline Phosphatase: 78 U/L (ref 40–115)
CO2: 28 mmol/L (ref 20–31)
Sodium: 137 mmol/L (ref 135–146)
Total Bilirubin: 0.9 mg/dL (ref 0.2–1.2)

## 2016-06-05 NOTE — Addendum Note (Signed)
Addended by: Monica BectonHEKKEKANDAM, Ireene Ballowe J on: 06/05/2016 02:12 PM   Modules accepted: Kipp BroodSmartSet

## 2016-06-10 ENCOUNTER — Ambulatory Visit (INDEPENDENT_AMBULATORY_CARE_PROVIDER_SITE_OTHER): Payer: BLUE CROSS/BLUE SHIELD | Admitting: Sports Medicine

## 2016-06-10 ENCOUNTER — Ambulatory Visit (INDEPENDENT_AMBULATORY_CARE_PROVIDER_SITE_OTHER): Payer: BLUE CROSS/BLUE SHIELD

## 2016-06-10 DIAGNOSIS — M79671 Pain in right foot: Secondary | ICD-10-CM | POA: Diagnosis not present

## 2016-06-10 DIAGNOSIS — M722 Plantar fascial fibromatosis: Secondary | ICD-10-CM | POA: Diagnosis not present

## 2016-06-10 NOTE — Progress Notes (Signed)
  Subjective:    CC:  Right foot pain  HPI: This is a pleasant 45 year old male, we've treated him in the past for plantar fasciitis with custom orthotics, injection. He has essentially done extremely well, more recently we added heel cups, and he's had no improvement in his pain. On further questioning his pain is on the medial and lateral aspects of the calcaneus ruling out on the bottom of. He spends all day on his feet at work, and feels that this severely exacerbates symptoms. Moderate, persistent without radiation.  Past medical history, Surgical history, Family history not pertinant except as noted below, Social history, Allergies, and medications have been entered into the medical record, reviewed, and no changes needed.   Review of Systems: No fevers, chills, night sweats, weight loss, chest pain, or shortness of breath.   Objective:    General: Well Developed, well nourished, and in no acute distress.  Neuro: Alert and oriented x3, extra-ocular muscles intact, sensation grossly intact.  HEENT: Normocephalic, atraumatic, pupils equal round reactive to light, neck supple, no masses, no lymphadenopathy, thyroid nonpalpable.  Skin: Warm and dry, no rashes. Cardiac: Regular rate and rhythm, no murmurs rubs or gallops, no lower extremity edema.  Respiratory: Clear to auscultation bilaterally. Not using accessory muscles, speaking in full sentences. Right Foot: No visible erythema or swelling. Range of motion is full in all directions. Strength is 5/5 in all directions. No hallux valgus. No pes cavus or pes planus. No abnormal callus noted. No pain over the navicular prominence, or base of fifth metatarsal. No tenderness to palpation of the calcaneal insertion of plantar fascia. No pain at the Achilles insertion. No pain over the calcaneal bursa. No pain of the retrocalcaneal bursa. Positive calcaneal squeeze No tenderness to palpation over the tarsals, metatarsals, or  phalanges. No hallux rigidus or limitus. No tenderness palpation over interphalangeal joints. No pain with compression of the metatarsal heads. Neurovascularly intact distally.  Impression and Recommendations:    I spent 25 minutes with this patient, greater than 50% was face-to-face time counseling regarding the above diagnoses

## 2016-06-10 NOTE — Assessment & Plan Note (Signed)
Persistent pain at the right heel, this time with a positive squeeze test suggestive of a calcaneal stress fracture. Continue orthotics, gel cups provided no improvement. As symptoms have been present now for months and months and months, despite physician directed conservative treatment, we are going to proceed with x-ray and MRI of the right foot. We'll have to find ways of modified weightbearing

## 2016-06-11 ENCOUNTER — Encounter: Payer: Self-pay | Admitting: Sports Medicine

## 2016-06-16 ENCOUNTER — Ambulatory Visit (INDEPENDENT_AMBULATORY_CARE_PROVIDER_SITE_OTHER): Payer: BLUE CROSS/BLUE SHIELD

## 2016-06-16 DIAGNOSIS — M722 Plantar fascial fibromatosis: Secondary | ICD-10-CM

## 2016-06-16 DIAGNOSIS — M7731 Calcaneal spur, right foot: Secondary | ICD-10-CM | POA: Diagnosis not present

## 2016-06-18 ENCOUNTER — Ambulatory Visit (INDEPENDENT_AMBULATORY_CARE_PROVIDER_SITE_OTHER): Payer: BLUE CROSS/BLUE SHIELD | Admitting: Sports Medicine

## 2016-06-18 ENCOUNTER — Encounter: Payer: Self-pay | Admitting: Sports Medicine

## 2016-06-18 DIAGNOSIS — M722 Plantar fascial fibromatosis: Secondary | ICD-10-CM

## 2016-06-18 NOTE — Progress Notes (Signed)
  Subjective:    CC: Follow-up  HPI: Foot pain: Recently had an MRI, the results of which will be dictated below, he has pain both on the posterior ankle as well as the plantar aspect of the calcaneus. Moderate, persistent.  Past medical history, Surgical history, Family history not pertinant except as noted below, Social history, Allergies, and medications have been entered into the medical record, reviewed, and no changes needed.   Review of Systems: No fevers, chills, night sweats, weight loss, chest pain, or shortness of breath.   Objective:    General: Well Developed, well nourished, and in no acute distress.  Neuro: Alert and oriented x3, extra-ocular muscles intact, sensation grossly intact.  HEENT: Normocephalic, atraumatic, pupils equal round reactive to light, neck supple, no masses, no lymphadenopathy, thyroid nonpalpable.  Skin: Warm and dry, no rashes. Cardiac: Regular rate and rhythm, no murmurs rubs or gallops, no lower extremity edema.  Respiratory: Clear to auscultation bilaterally. Not using accessory muscles, speaking in full sentences.  MRI personally reviewed, there is active plantar fasciitis with edema and increased T2 signal in the proximal plantar fascia as well as the flexor digitorum brevis muscle belly, there also appears to be a ganglion cyst protruding from the posterior aspect of the talocalcaneal joint.  Impression and Recommendations:    Plantar fasciitis, right MRI confirms plantar fasciitis and edema in the flexor digitorum brevis muscle belly, there is also with his to be a small ganglion cyst at the posterior aspect of the talocalcaneal joint that could be responsible for posterior ankle pain. Next line we are going to start initially with CAM boot immobilization for 2 weeks, we will have to fill out short-term disability paperwork. If insufficient response by the follow-up visit we will perform injections for plantar fasciitis and posterior  talocalcaneal joint ganglion.  I spent 25 minutes with this patient, greater than 50% was face-to-face time counseling regarding the above diagnoses

## 2016-06-18 NOTE — Assessment & Plan Note (Signed)
MRI confirms plantar fasciitis and edema in the flexor digitorum brevis muscle belly, there is also with his to be a small ganglion cyst at the posterior aspect of the talocalcaneal joint that could be responsible for posterior ankle pain. Next line we are going to start initially with CAM boot immobilization for 2 weeks, we will have to fill out short-term disability paperwork. If insufficient response by the follow-up visit we will perform injections for plantar fasciitis and posterior talocalcaneal joint ganglion.

## 2016-07-02 ENCOUNTER — Ambulatory Visit (INDEPENDENT_AMBULATORY_CARE_PROVIDER_SITE_OTHER): Payer: BLUE CROSS/BLUE SHIELD | Admitting: Sports Medicine

## 2016-07-02 DIAGNOSIS — M722 Plantar fascial fibromatosis: Secondary | ICD-10-CM | POA: Diagnosis not present

## 2016-07-02 NOTE — Assessment & Plan Note (Signed)
MRI confirms plantar fasciitis and edema in the flexor digitorum brevis muscle belly, there is also with his to be a small ganglion cyst at the posterior aspect of the talocalcaneal joint that could be responsible for posterior ankle pain. Next line we are going to start initially with CAM boot immobilization for 2 weeks, we will have to fill out short-term disability paperwork. If insufficient response by the follow-up visit we will perform injections for plantar fasciitis and posterior talocalcaneal joint ganglion.  He has another week of CAM boot immobilization and then can return for injection, disability paperwork filled out today.

## 2016-07-02 NOTE — Progress Notes (Signed)
  Subjective:    CC: Paperwork  HPI: Colin Carter is here for me to fill out short-term disability paperwork, he still has pain, mostly at the back of his ankle. He did have evidence of a posterior tibiotalar ganglion cyst as well as plantar fasciitis however plantar fasciitis is not causing him much pain.  Past medical history, Surgical history, Family history not pertinant except as noted below, Social history, Allergies, and medications have been entered into the medical record, reviewed, and no changes needed.   Review of Systems: No fevers, chills, night sweats, weight loss, chest pain, or shortness of breath.   Objective:    General: Well Developed, well nourished, and in no acute distress.  Neuro: Alert and oriented x3, extra-ocular muscles intact, sensation grossly intact.  HEENT: Normocephalic, atraumatic, pupils equal round reactive to light, neck supple, no masses, no lymphadenopathy, thyroid nonpalpable.  Skin: Warm and dry, no rashes. Cardiac: Regular rate and rhythm, no murmurs rubs or gallops, no lower extremity edema.  Respiratory: Clear to auscultation bilaterally. Not using accessory muscles, speaking in full sentences. Right foot: In a boot.  Impression and Recommendations:    Plantar fasciitis, right MRI confirms plantar fasciitis and edema in the flexor digitorum brevis muscle belly, there is also with his to be a small ganglion cyst at the posterior aspect of the talocalcaneal joint that could be responsible for posterior ankle pain. Next line we are going to start initially with CAM boot immobilization for 2 weeks, we will have to fill out short-term disability paperwork. If insufficient response by the follow-up visit we will perform injections for plantar fasciitis and posterior talocalcaneal joint ganglion.  He has another week of CAM boot immobilization and then can return for injection, disability paperwork filled out today.  I spent 25 minutes with this patient,  greater than 50% was face-to-face time counseling regarding the above diagnoses

## 2016-07-08 ENCOUNTER — Telehealth: Payer: Self-pay

## 2016-07-08 NOTE — Telephone Encounter (Signed)
Pt called stating he talked to billing and says that it needs to be coded as routine blood work. Please advise

## 2016-07-08 NOTE — Telephone Encounter (Signed)
There is no such diagnosis.  It was coded for annual physical exam.

## 2016-07-09 ENCOUNTER — Encounter: Payer: Self-pay | Admitting: Sports Medicine

## 2016-07-09 ENCOUNTER — Ambulatory Visit (INDEPENDENT_AMBULATORY_CARE_PROVIDER_SITE_OTHER): Payer: BLUE CROSS/BLUE SHIELD | Admitting: Sports Medicine

## 2016-07-09 ENCOUNTER — Telehealth: Payer: Self-pay

## 2016-07-09 DIAGNOSIS — M722 Plantar fascial fibromatosis: Secondary | ICD-10-CM | POA: Diagnosis not present

## 2016-07-09 DIAGNOSIS — I10 Essential (primary) hypertension: Secondary | ICD-10-CM

## 2016-07-09 MED ORDER — LISINOPRIL 10 MG PO TABS: 10.0000 mg | ORAL_TABLET | Freq: Every day | ORAL | 3 refills | Status: DC

## 2016-07-09 NOTE — Progress Notes (Signed)
  Subjective:    CC: Follow-up  HPI: Colin Carter has multifactorial ankle pain, he has plantar fasciitis as well as a posterior talocrural ganglion cyst. He agrees for talocrural injection but not plantar fascia injection, he is still on short-term disability.  Past medical history, Surgical history, Family history not pertinant except as noted below, Social history, Allergies, and medications have been entered into the medical record, reviewed, and no changes needed.   Review of Systems: No fevers, chills, night sweats, weight loss, chest pain, or shortness of breath.   Objective:    General: Well Developed, well nourished, and in no acute distress.  Neuro: Alert and oriented x3, extra-ocular muscles intact, sensation grossly intact.  HEENT: Normocephalic, atraumatic, pupils equal round reactive to light, neck supple, no masses, no lymphadenopathy, thyroid nonpalpable.  Skin: Warm and dry, no rashes. Cardiac: Regular rate and rhythm, no murmurs rubs or gallops, no lower extremity edema.  Respiratory: Clear to auscultation bilaterally. Not using accessory muscles, speaking in full sentences.  Procedure: Real-time Ultrasound Guided Injection of left tibiotalar ganglion Device: GE Logiq E  Verbal informed consent obtained.  Time-out conducted.  Noted no overlying erythema, induration, or other signs of local infection.  Skin prepped in a sterile fashion.  Local anesthesia: Topical Ethyl chloride.  With sterile technique and under real time ultrasound guidance:  Injection as above, posterior approach used, 1 mL kenalog 40, 1 mL lidocaine, 1 mL Marcaine. Completed without difficulty  Pain immediately resolved suggesting accurate placement of the medication.  Advised to call if fevers/chills, erythema, induration, drainage, or persistent bleeding.  Images permanently stored and available for review in the ultrasound unit.  Impression: Technically successful ultrasound guided  injection.  Impression and Recommendations:    Plantar fasciitis, right Persistent pain in the back of the ankle and the plantar fascia. Plantar fascia pain is minimal. We are going to proceed with injection at the posterior tibiotalar joint.  Return to see me in one month.  Hypertension Decreasing lisinopril to 10 mg daily.

## 2016-07-09 NOTE — Telephone Encounter (Signed)
Colin RavelingBrian [facility] with [facility] employer is trying to process the paperwork for patient restricted return to work. The company would like to know:  How much can he lift? How much can he push? How much can he pull? How long can he stand?  This information needs to be faxed to [phone] with his claim number [id number]

## 2016-07-09 NOTE — Assessment & Plan Note (Signed)
Persistent pain in the back of the ankle and the plantar fascia. Plantar fascia pain is minimal. We are going to proceed with injection at the posterior tibiotalar joint.  Return to see me in one month.

## 2016-07-09 NOTE — Assessment & Plan Note (Signed)
Decreasing lisinopril to 10 mg daily.

## 2016-07-10 ENCOUNTER — Encounter: Payer: Self-pay | Admitting: Sports Medicine

## 2016-07-10 NOTE — Telephone Encounter (Signed)
Faxed letter

## 2016-07-10 NOTE — Telephone Encounter (Signed)
Letter written and given to you

## 2016-07-14 ENCOUNTER — Encounter: Payer: Self-pay | Admitting: Sports Medicine

## 2016-07-14 DIAGNOSIS — M25579 Pain in unspecified ankle and joints of unspecified foot: Secondary | ICD-10-CM

## 2016-07-28 ENCOUNTER — Encounter: Payer: Self-pay | Admitting: Podiatry

## 2016-07-28 ENCOUNTER — Ambulatory Visit (INDEPENDENT_AMBULATORY_CARE_PROVIDER_SITE_OTHER): Payer: BLUE CROSS/BLUE SHIELD | Admitting: Podiatry

## 2016-07-28 VITALS — BP 132/93 | HR 80 | Ht 68.0 in | Wt 159.0 lb

## 2016-07-28 DIAGNOSIS — M722 Plantar fascial fibromatosis: Secondary | ICD-10-CM

## 2016-07-28 DIAGNOSIS — M79673 Pain in unspecified foot: Secondary | ICD-10-CM | POA: Diagnosis not present

## 2016-07-28 DIAGNOSIS — R262 Difficulty in walking, not elsewhere classified: Secondary | ICD-10-CM

## 2016-07-28 DIAGNOSIS — M79604 Pain in right leg: Secondary | ICD-10-CM

## 2016-07-28 NOTE — Patient Instructions (Signed)
Seen for pain in right foot. Reviewed findings; unstable first ray with pronating rearfoot with weight bearing. Metatarsal binder dispensed to aid stability of the first ray. Continue with orhotics. Do lower limb strengthening exercise. Return in 3 weeks for evaluation.

## 2016-07-28 NOTE — Progress Notes (Signed)
SUBJECTIVE: 45 y.o. year old male presents complaining of pain at right heel, plantar surface along the margins, start hurting at the heel once start walking. After 20 minutes of walking, pain starts at ankle joint on right, duration of 2 months.  [facility] 3 injections on heel, through bottom, [facility], another injection at lateral ankle joint, Tibiotalar complex. No pain at rest. Pain starts once start on weight bearing.  Past injections did not help. Also treated with custom orthotics. History of plantar fasciitis and Achilles tendonitis off and on for the past 2 years.  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   REVIEW OF SYSTEMS: Pertinent items noted in HPI and remainder of comprehensive ROS otherwise negative.  OBJECTIVE: DERMATOLOGIC EXAMINATION: No abnormal skin lesions.  VASCULAR EXAMINATION OF LOWER LIMBS: Pedal pulses: All pedal pulses are palpable with normal pulsation.  Temperature gradient from tibial crest to dorsum of foot is within normal bilateral.  NEUROLOGIC EXAMINATION OF THE LOWER LIMBS: All epicritic and tactile sensations grossly intact.  Sharp and Dull discriminatory sensations at the plantar ball of hallux is intact bilateral.   MUSCULOSKELETAL EXAMINATION: Positive for excess sagittal plane motion of the first ray R>L.  Unstable first ray bilateral.  Pain to squeeze the heel if been on feet for a while. Subjective pain at anterior lateral near the lateral gutter, and medial ankle near the course of posterior tibialis tendon.  ASSESSMENT: Plantar fasciitis right. Hypermobile first ray bilateral R>L. Secondary ankle joint pain from STJ hyperpronation with weight bearing. Difficulty walking. Pain in right lower limbs.  PLAN: Reviewed findings including previous radiology reports, and available treatment options including possible surgical intervention. Advised to use Metatarsal binder to stabilize  the first ray along with custom orthotics in well supported tennis shoes. May stay off of feet for another month and start exercise to strengthen the lower limb, ex. Stationary bike. Return in 3 weeks to re-evaluate.

## 2016-08-06 ENCOUNTER — Ambulatory Visit: Payer: BLUE CROSS/BLUE SHIELD | Admitting: Sports Medicine

## 2016-08-18 ENCOUNTER — Encounter: Payer: Self-pay | Admitting: Podiatry

## 2016-08-18 ENCOUNTER — Ambulatory Visit (INDEPENDENT_AMBULATORY_CARE_PROVIDER_SITE_OTHER): Payer: BLUE CROSS/BLUE SHIELD | Admitting: Podiatry

## 2016-08-18 VITALS — BP 128/81 | HR 68

## 2016-08-18 DIAGNOSIS — M21969 Unspecified acquired deformity of unspecified lower leg: Secondary | ICD-10-CM | POA: Diagnosis not present

## 2016-08-18 DIAGNOSIS — M722 Plantar fascial fibromatosis: Secondary | ICD-10-CM

## 2016-08-18 DIAGNOSIS — R262 Difficulty in walking, not elsewhere classified: Secondary | ICD-10-CM

## 2016-08-18 MED ORDER — DICLOFENAC EPOLAMINE 1.3 % TD PTCH: 1.0000 | MEDICATED_PATCH | Freq: Two times a day (BID) | TRANSDERMAL | 1 refills | Status: DC

## 2016-08-18 NOTE — Progress Notes (Signed)
SUBJECTIVE: 45 y.o. year old male presents for follow up on right heel pain. Stated that pain is at plantar and medial and lateral heel on right . Still having difficulty be on feet long hours due to pain after been on feet for a couple of hours. Cannot stand on feet all day, which is required at work. Has not been on feet much since last visit but the pain is still present with less extent compared with the first visit. Wearing metatarsal binder.   HPI: History of plantar fasciitis and Achilles tendonitis off and on for since 2015. 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  This time Plantar fasciitis developed since middle of June 2017 and failed to improve to this date. Has had plantar fasciitis 2 years ago and was treated with cortisone injection by other podiatrists. Received another injection near ankle joint in July 2017 without giving him measurable improvement.  Been wearing Orthotics since last year. Doing daily strengthening exercise. Wearing Metatarsal binder since 07/28/16.    OBJECTIVE: DERMATOLOGIC EXAMINATION: Normal findings. No abnormal skin lesions. VASCULAR EXAMINATION OF LOWER LIMBS: All pedal pulses are palpable with normal pulsation.  No visible edema or erythema noted.  NEUROLOGIC EXAMINATION OF THE LOWER LIMBS: All epicritic and tactile sensations grossly intact.  MUSCULOSKELETAL EXAMINATION: Positive for excess sagittal plane motion of the first ray R>L.  Unstable first ray bilateral R>L upon loading of forefoot.  Pain in right heel, plantar, and medial and lateral squeeze.  Pain at anterior  Inferior margin of Achille tendon insertion area right foot.  Reviewed [facility]. The findings were consistent with plantar fasciitis with small inferior calcaneal spur.   ASSESSMENT: Chronic, unresolved plantar fasciitis right. Hypermobile first ray bilateral R>L. Secondary ankle joint pain from  STJ hyperpronation with weight bearing. Difficulty standing or walking more than 2 hours at a time.   PLAN: Reviewed findings including previous radiology reports, and available treatment options including possible surgical intervention. Continue with stretch and strengthening exercise. Increase weight bearing and ambulation as tolerated. Rx Flector patch e scribed to facilitate mobility. Extended leave from work for another month.  Return in one month for evaluation.

## 2016-08-18 NOTE — Patient Instructions (Addendum)
Pain in right heel. Still cannot be on feet more than 2 hours at a time without experiencing much pain.  Continue with lower limb strengthening exercise and increase activity as tolerated.  Will try Flector patch. Rx sent via e-scribe.  Need be out of work for another month till next evaluation.  Return in one month.

## 2016-09-12 ENCOUNTER — Encounter: Payer: Self-pay | Admitting: Sports Medicine

## 2016-09-22 ENCOUNTER — Encounter: Payer: Self-pay | Admitting: Podiatry

## 2016-09-22 ENCOUNTER — Ambulatory Visit (INDEPENDENT_AMBULATORY_CARE_PROVIDER_SITE_OTHER): Payer: BLUE CROSS/BLUE SHIELD | Admitting: Podiatry

## 2016-09-22 VITALS — BP 121/85 | HR 68

## 2016-09-22 DIAGNOSIS — M21969 Unspecified acquired deformity of unspecified lower leg: Secondary | ICD-10-CM

## 2016-09-22 DIAGNOSIS — R262 Difficulty in walking, not elsewhere classified: Secondary | ICD-10-CM | POA: Diagnosis not present

## 2016-09-22 DIAGNOSIS — M722 Plantar fascial fibromatosis: Secondary | ICD-10-CM

## 2016-09-22 NOTE — Patient Instructions (Addendum)
Seen for Pain on right heel.  As per request Cortisone injection given in right plantar medial heel. New letter for excuse from work for another month written. Return in 2 weeks.

## 2016-09-22 NOTE — Progress Notes (Signed)
SUBJECTIVE: 45 y.o.year old malepresents for follow up on right heel pain. Patient points plantar medial aspect of right heel being the source of pain. Stated that he was out shopping with his wife for a few hours and had to be off of his feet for 3 days due to severe pain.  Stated that there has not been much improvement since the first visit (07/28/16) . Flector patch works only if not on feet. As long as on feet, pain is severe. He is still using Metatarsal binder and Night Splint.  HPI: History of plantar fasciitis and Achilles tendonitis off and on for since 2015. 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  This time Plantar fasciitis developed since middle of June 2017 and failed to improve to this date. Has had plantar fasciitis 2 years ago and was treated with cortisone injection by other podiatrists. Received another injection near ankle joint in July 2017 without giving him measurable improvement.  Been wearing Orthotics since last year. Doing daily strengthening exercise. Wearing Metatarsal binder since 07/28/16.    OBJECTIVE: DERMATOLOGIC EXAMINATION: Normal findings. No abnormal skin lesions. VASCULAR EXAMINATION OF LOWER LIMBS: All pedal pulses are palpable with normal pulsation.  No visible edema or erythema noted.  NEUROLOGIC EXAMINATION OF THE LOWER LIMBS: All epicritic and tactile sensations grossly intact.  MUSCULOSKELETAL EXAMINATION: Positive for excess sagittal plane motion of the first ray R>L.  Unstable first ray bilateral R>L upon loading of forefoot.  Pain in right heel, plantar, and medial and lateral squeeze.  Pain at anterior  Inferior margin of Achille tendon insertion area right foot.  Reviewed old [facility]. The findings were consistent with plantar fasciitis with small inferior calcaneal spur.   ASSESSMENT: Chronic, unresolved plantar fasciitis right. Hypermobile first ray bilateral  R>L. Secondary ankle joint pain from STJ hyperpronation with weight bearing. Difficulty standing or walking more than 2 hours at a time.   PLAN: Reviewed findings and available treatment options including possible surgical intervention. Continue with stretch and strengthening exercise. Increase weight bearing and ambulation as tolerated. Use Flector patch as needed.  Extended leave from work for another month (10/22/16).  Return in 2 weeks.

## 2016-09-24 ENCOUNTER — Encounter: Payer: Self-pay | Admitting: Sports Medicine

## 2016-10-06 ENCOUNTER — Ambulatory Visit (INDEPENDENT_AMBULATORY_CARE_PROVIDER_SITE_OTHER): Payer: BLUE CROSS/BLUE SHIELD | Admitting: Podiatry

## 2016-10-06 DIAGNOSIS — M21969 Unspecified acquired deformity of unspecified lower leg: Secondary | ICD-10-CM

## 2016-10-06 DIAGNOSIS — M216X1 Other acquired deformities of right foot: Secondary | ICD-10-CM | POA: Diagnosis not present

## 2016-10-06 DIAGNOSIS — M722 Plantar fascial fibromatosis: Secondary | ICD-10-CM

## 2016-10-06 NOTE — Patient Instructions (Signed)
Seen for pain in right foot.  Pain due to weak first metatarsal bone and hyperpronating foot causing pulling of plantar fascial band, compression to plantar medial calcaneal nerve branch, and jamming of lateral ankle area.  No improvement yet with previous conservative treatment, NSAIA, injection, orthotics, rest, night splint, patches, ankle brace, metatarsal binder etc. May benefit from 2nd opinion from Dr. Sudie Baileyob Mothershed in [place] before surgical options are discussed.

## 2016-10-06 NOTE — Progress Notes (Signed)
SUBJECTIVE: 45 y.o.year old malepresents for follow up on right heel pain.  Pain is the same as before, at plantar medial heel and lateral sinus tarsi area after a prolonged weight bearing.  Patient points plantar medial aspect of right heel being the source of pain.  As long as on feet, pain is severe. He is still using Metatarsal binder and Night Splint. Patient expresses frustration due to failed improvement with conservative treatments so far and surgical option is out of discussion because he wants to be able to return to work by January with decent improvement. HPI: History of plantar fasciitis and Achilles tendonitis off and on for since 2015. 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  This time Plantar fasciitis developed since middle of June 2017 and failed to improve to this date. Has had plantar fasciitis 2 years ago and was treated with cortisone injection by other podiatrists. Received another injection near ankle joint in July 2017 without giving him measurable improvement.  Been wearing Orthotics since last year. Doingdaily strengthening exercise. Wearing Metatarsal binder since 07/28/16.   OBJECTIVE: DERMATOLOGIC EXAMINATION: Normal findings. No abnormal skin lesions. VASCULAR EXAMINATION OF LOWER LIMBS: All pedal pulses are palpable with normal pulsation.  No visible edema or erythema noted.  NEUROLOGIC EXAMINATION OF THE LOWER LIMBS: All epicritic and tactile sensations grossly intact.  MUSCULOSKELETAL EXAMINATION: Positive for excess sagittal plane motion of the first ray R>L.  Unstable first ray bilateral R>L upon loading of forefoot.  Pain in right heel, plantar, and medial and lateral squeeze. Pain at anterior Inferior margin of Achille tendon insertion area right foot. Reviewed [facility]. The findings were consistent with plantar fasciitis with small inferior calcaneal spur.    ASSESSMENT: Chronic, unresolved plantar fasciitis right. Hypermobile first ray bilateral R>L. Secondary ankle joint pain from STJ hyperpronation with weight bearing. 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  Difficulty standing or walkingmore than 2 hours at a time.   PLAN: Reviewed findings and available treatment options including possible surgical intervention. No improvement yet with previous conservative treatment, NSAIA, injection, orthotics, rest, night splint, patches, ankle brace, metatarsal binder etc. Advised to get 2nd opinion from Dr. Sudie Baileyob Mothershed in [place] before surgical options are discussed. Patient is to return after been seen by Dr. Karle BarrMothershed.

## 2016-10-07 ENCOUNTER — Encounter: Payer: Self-pay | Admitting: Podiatry

## 2016-10-17 ENCOUNTER — Other Ambulatory Visit: Payer: Self-pay | Admitting: Podiatry

## 2016-10-27 ENCOUNTER — Ambulatory Visit (INDEPENDENT_AMBULATORY_CARE_PROVIDER_SITE_OTHER): Payer: BLUE CROSS/BLUE SHIELD | Admitting: Podiatry

## 2016-10-27 ENCOUNTER — Encounter: Payer: Self-pay | Admitting: Podiatry

## 2016-10-27 VITALS — BP 137/75 | HR 67

## 2016-10-27 DIAGNOSIS — M722 Plantar fascial fibromatosis: Secondary | ICD-10-CM

## 2016-10-27 DIAGNOSIS — M216X1 Other acquired deformities of right foot: Secondary | ICD-10-CM | POA: Diagnosis not present

## 2016-10-27 DIAGNOSIS — R262 Difficulty in walking, not elsewhere classified: Secondary | ICD-10-CM

## 2016-10-27 DIAGNOSIS — M21969 Unspecified acquired deformity of unspecified lower leg: Secondary | ICD-10-CM

## 2016-10-27 NOTE — Progress Notes (Signed)
SUBJECTIVE: 45 y.o.year old malepresents for follow up on right foot pain.  As instructed, he was seen by Colin Carter who treated him with Cortisone injection, CAM walker boot, and Prednisone pills (12 day course) that started 3 days ago.  He is scheduled to be back to see Colin Carter in January 4th 2018. If all things go well, he will be released back to work on November 24, 2016. Stated taht he feels better than before. At this time he is able to walk with much less discomfort. He was on CAM walker boot and been resting since he saw Colin Carter.  HPI: History of plantar fasciitis and Achilles tendonitis since 2015. Job requires to be on feet 8-10 hours a day.  Plantar fasciitis flared up since middle of June 2017 and failed to improve till last week. Due to poor response to recent treatment with stretch exercise, Orthotics, Cortisone injection, resting, night splint, metatarsal binder, he was referred out to Colin Carter or 2nd opinion.   OBJECTIVE: No new findings other than subjective relief of right foot pain with recent treatment that consisted of CAM walker boot, Cortisone injection, Steroid pills, and rest.  DERMATOLOGIC EXAMINATION: Normal findings. No abnormal skin lesions., VASCULAR EXAMINATION OF LOWER LIMBS: All pedal pulses are palpable with normal pulsation.  No visible edema or erythema noted.  NEUROLOGIC EXAMINATION OF THE LOWER LIMBS: All epicritic and tactile sensations grossly intact.  MUSCULOSKELETAL EXAMINATION: Positive for excess sagittal plane motion of the first ray R>L.  Unstable first ray bilateral R>L upon loading of forefoot.  Mild to moderated discomfort on right lower limb with prolonged weight bearing.   ASSESSMENT: Improving chronic plantar fasciitis right foot and ankle with recent treatment (CAM walker boot, Injection, Steroid pills, Rest).  Hypermobile first ray bilateral R>L. Secondary ankle joint pain from STJ hyperpronation with  weight bearing. 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   PLAN: Advised to continue to increase weight bearing and ambulation as tolerated.  Return back to Colin Carter for final release.

## 2016-10-27 NOTE — Patient Instructions (Signed)
Follow up on right foot pain. Had a visit with Dr. Elvera Lennox. Mothershed who gave him Cortisone injection, Steroid pills, and CAM walker boot. Patient is feeling better now. Next visit with him will be January 4 and expect to be released back to work on January 8. Follow Dr. Karle BarrMothershed instruction. May increase weight bearing and ambulation in regular shoes as tolerated. Return as needed.

## 2016-10-29 ENCOUNTER — Encounter: Payer: Self-pay | Admitting: Sports Medicine

## 2016-11-07 ENCOUNTER — Ambulatory Visit (INDEPENDENT_AMBULATORY_CARE_PROVIDER_SITE_OTHER): Payer: BLUE CROSS/BLUE SHIELD | Admitting: Sports Medicine

## 2016-11-07 ENCOUNTER — Encounter: Payer: Self-pay | Admitting: Sports Medicine

## 2016-11-07 DIAGNOSIS — I10 Essential (primary) hypertension: Secondary | ICD-10-CM | POA: Diagnosis not present

## 2016-11-07 DIAGNOSIS — M722 Plantar fascial fibromatosis: Secondary | ICD-10-CM

## 2016-11-07 NOTE — Progress Notes (Signed)
  Subjective:    CC: Follow-up  HPI: This is a 45 year old male, he comes in with chronic pain of the right heel, we tried multiple modalities including several injections, custom orthotics, he seen a couple of podiatrist had multiple injections with them as well, MRI simply showed some mild appearing plantar plantar fascia is in the foot, a lumbar spine MRI was essentially negative. Unfortunately he has had persistent pain despite seeing multiple providers. Pain is localized at the plantar fascia without radiation, he describes it as extremely severe.  Past medical history:  Negative.  See flowsheet/record as well for more information.  Surgical history: Negative.  See flowsheet/record as well for more information.  Family history: Negative.  See flowsheet/record as well for more information.  Social history: Negative.  See flowsheet/record as well for more information.  Allergies, and medications have been entered into the medical record, reviewed, and no changes needed.   Review of Systems: No fevers, chills, night sweats, weight loss, chest pain, or shortness of breath.   Objective:    General: Well Developed, well nourished, and in no acute distress.  Neuro: Alert and oriented x3, extra-ocular muscles intact, sensation grossly intact.  HEENT: Normocephalic, atraumatic, pupils equal round reactive to light, neck supple, no masses, no lymphadenopathy, thyroid nonpalpable.  Skin: Warm and dry, no rashes. Cardiac: Regular rate and rhythm, no murmurs rubs or gallops, no lower extremity edema.  Respiratory: Clear to auscultation bilaterally. Not using accessory muscles, speaking in full sentences. Right Foot: No visible erythema or swelling. Range of motion is full in all directions. Strength is 5/5 in all directions. No hallux valgus. No pes cavus or pes planus. No abnormal callus noted. No pain over the navicular prominence, or base of fifth metatarsal. No tenderness to palpation of  the calcaneal insertion of plantar fascia. No pain at the Achilles insertion. No pain over the calcaneal bursa. No pain of the retrocalcaneal bursa. No tenderness to palpation over the tarsals, metatarsals, or phalanges. No hallux rigidus or limitus. No tenderness palpation over interphalangeal joints. No pain with compression of the metatarsal heads. Neurovascularly intact distally.  Impression and Recommendations:    Plantar fasciitis, right Persistent symptoms of plantar fasciitis, confirmed on an initial MRI, has seen 2 different podiatrists. We are going to obtain a new MRI this time with IV contrast as [facility] to try and find another pain generator. He said multiple injections, multiple courses of steroids. He's had orthotics multiple times. His psychiatric provider did recommend Cymbalta which I think is appropriate. Return to see me to go over MRI results.  Hypertension Stick with 20 mg of lisinopril.  I spent 25 minutes with this patient, greater than 50% was face-to-face time counseling regarding the above diagnoses

## 2016-11-07 NOTE — Assessment & Plan Note (Signed)
Stick with 20 mg of lisinopril.

## 2016-11-07 NOTE — Addendum Note (Signed)
Addended by: Baird KayUGLAS, Elice Crigger M on: 11/07/2016 11:56 AM   Modules accepted: Orders

## 2016-11-07 NOTE — Assessment & Plan Note (Signed)
Persistent symptoms of plantar fasciitis, confirmed on an initial MRI, has seen 2 different podiatrists. We are going to obtain a new MRI this time with IV contrast [facility] to try and find another pain generator. He said multiple injections, multiple courses of steroids. He's had orthotics multiple times. His psychiatric provider did recommend Cymbalta which I think is appropriate. Return to see me to go over MRI results.

## 2016-11-08 LAB — BASIC METABOLIC PANEL WITH GFR
Calcium: 9.4 mg/dL (ref 8.6–10.3)
Creat: 1.07 mg/dL (ref 0.60–1.35)
GFR, Est African American: >89 mL/min (ref >=60)
GFR, Est Non African American: 84 mL/min (ref >=60)
Glucose, Bld: 91 mg/dL (ref 65–99)
Sodium: 138 mmol/L (ref 135–146)

## 2016-11-08 LAB — BASIC METABOLIC PANEL WITHOUT GFR
BUN: 20 mg/dL (ref 7–25)
CO2: 23 mmol/L (ref 20–31)
Chloride: 100 mmol/L (ref 98–110)
Potassium: 4.4 mmol/L (ref 3.5–5.3)

## 2016-11-11 ENCOUNTER — Ambulatory Visit (INDEPENDENT_AMBULATORY_CARE_PROVIDER_SITE_OTHER): Payer: BLUE CROSS/BLUE SHIELD

## 2016-11-11 ENCOUNTER — Other Ambulatory Visit: Payer: Self-pay | Admitting: Sports Medicine

## 2016-11-11 DIAGNOSIS — M7731 Calcaneal spur, right foot: Secondary | ICD-10-CM | POA: Diagnosis not present

## 2016-11-11 DIAGNOSIS — M722 Plantar fascial fibromatosis: Secondary | ICD-10-CM | POA: Diagnosis not present

## 2016-11-11 DIAGNOSIS — M659 Synovitis and tenosynovitis, unspecified: Secondary | ICD-10-CM | POA: Diagnosis not present

## 2016-11-11 DIAGNOSIS — M79671 Pain in right foot: Secondary | ICD-10-CM

## 2016-11-11 MED ORDER — GADOBENATE DIMEGLUMINE 529 MG/ML IV SOLN
15.0000 mL | Freq: Once | INTRAVENOUS | Status: AC | PRN
  Administered 2016-11-11: 14 mL via INTRAVENOUS

## 2016-11-25 ENCOUNTER — Encounter: Payer: Self-pay | Admitting: Sports Medicine

## 2016-11-25 DIAGNOSIS — R200 Anesthesia of skin: Secondary | ICD-10-CM

## 2016-11-25 DIAGNOSIS — R202 Paresthesia of skin: Principal | ICD-10-CM

## 2016-12-06 ENCOUNTER — Other Ambulatory Visit: Payer: Self-pay | Admitting: Sports Medicine

## 2017-05-05 ENCOUNTER — Emergency Department (INDEPENDENT_AMBULATORY_CARE_PROVIDER_SITE_OTHER)
Admission: EM | Admit: 2017-05-05 | Discharge: 2017-05-05 | Disposition: A | Discharge status: Home / Self Care | Payer: BLUE CROSS/BLUE SHIELD | Source: Home / Self Care | Attending: Family Medicine | Admitting: Family Medicine

## 2017-05-05 ENCOUNTER — Encounter: Payer: Self-pay | Admitting: *Deleted

## 2017-05-05 DIAGNOSIS — J029 Acute pharyngitis, unspecified: Secondary | ICD-10-CM | POA: Diagnosis not present

## 2017-05-05 DIAGNOSIS — H6001 Abscess of right external ear: Secondary | ICD-10-CM | POA: Diagnosis not present

## 2017-05-05 LAB — POCT RAPID STREP A (OFFICE): Rapid Strep A Screen: NEGATIVE

## 2017-05-05 MED ORDER — PENICILLIN V POTASSIUM 500 MG PO TABS: ORAL_TABLET | ORAL | 0 refills | Status: DC

## 2017-05-05 NOTE — Discharge Instructions (Signed)
May take Ibuprofen 200mg , 4 tabs every 8 hours with food, as needed for sore throat.

## 2017-05-05 NOTE — ED Provider Notes (Signed)
[facility]    CSN: [id number] Arrival date & time: 05/05/17  1010     History   Chief Complaint Chief Complaint  Patient presents with  . Otalgia    HPI Colin Carter is a 46 y.o. male.   Patient developed bilateral earache three weeks ago.  His left ear improved after one week, but he continues to have ache in his right ear.  No fevers, chills, and sweats.  No recent URI.  No drainage from the ears.   The history is provided by the patient.    History reviewed. No pertinent past medical history.  Patient Active Problem List   Diagnosis Date Noted  . Hypertriglyceridemia 06/05/2016  . Foul smelling urine 06/04/2016  . Plantar fasciitis, right 06/05/2015  . Generalized anxiety disorder 09/19/2014  . Hypertension 06/23/2014  . Annual physical exam 03/10/2014  . Lumbar spondylosis 03/10/2014    History reviewed. No pertinent surgical history.     Home Medications    Prior to Admission medications   Medication Sig Start Date End Date Taking? Authorizing Provider  ALPRAZolam (NIRAVAM) 0.5 MG dissolvable tablet Take 1 tablet (0.5 mg total) by mouth 2 (two) times daily as needed for anxiety. 09/25/15  Yes Monica Bectonhekkekandam, Thomas J, MD  FLECTOR 1.3 % PT[facility] APPLY ONE PATCH TOPICALLY TWICE DAILY 10/20/16  Yes Sheard, Myeong O, DPM  lisinopril (PRINIVIL,ZESTRIL) 20 MG tablet TAKE ONE TABLET BY MOUTH ONCE DAILY. 12/07/16  Yes Monica Bectonhekkekandam, Thomas J, MD  penicillin v potassium (VEETID) 500 MG tablet Take one tab by mouth twice daily for 10 days 05/05/17   Lattie HawBeese, Stephen A, MD    Family History Family History  Problem Relation Age of Onset  . Heart attack Father     Social History Social History  Substance Use Topics  . Smoking status: Never Smoker  . Smokeless tobacco: Never Used  . Alcohol use No     Allergies   Patient has no known allergies.   Review of Systems Review of Systems ? sore throat No cough No pleuritic pain No wheezing No nasal  congestion No post-nasal drainage No sinus pain/pressure No itchy/red eyes + earache No hemoptysis No SOB No fever/chills No nausea No vomiting No abdominal pain No diarrhea No urinary symptoms No skin rash No fatigue No myalgias No headache Used Claritin without relief   Physical Exam Triage Vital Signs ED Triage Vitals  Enc Vitals Group     BP 05/05/17 1026 132/80     Pulse Rate 05/05/17 1026 83     Resp --      Temp 05/05/17 1026 98.7 F (37.1 C)     Temp Source 05/05/17 1026 Oral     SpO2 05/05/17 1026 97 %     Weight 05/05/17 1027 172 lb (78 kg)     Height --      Head Circumference --      Peak Flow --      Pain Score 05/05/17 1027 1     Pain Loc --      Pain Edu? --      Excl. in GC? --    No data found.   Updated Vital Signs BP 132/80 (BP Location: Left Arm)   Pulse 83   Temp 98.7 F (37.1 C) (Oral)   Wt 172 lb (78 kg)   SpO2 97%   BMI 26.15 kg/m   Visual Acuity Right Eye Distance:   Left Eye Distance:   Bilateral Distance:  Right Eye Near:   Left Eye Near:    Bilateral Near:     Physical Exam Nursing notes and Vital Signs reviewed. Appearance:  Patient appears stated age, and in no acute distress Eyes:  Pupils are equal, round, and reactive to light and accomodation.  Extraocular movement is intact.  Conjunctivae are not inflamed  Ears:  Canals normal.  Tympanic membranes normal.  No TMJ tenderness. Nose:  Mildly congested turbinates.  No sinus tenderness.  Pharynx:   Uvula is mildly edematous. Neck:  Supple.  Tonsillar nodes are tender to palpation bilaterally.   Lungs:  Clear to auscultation.  Breath sounds are equal.  Moving air well. Heart:  Regular rate and rhythm without murmurs, rubs, or gallops.  Abdomen:  Nontender without masses or hepatosplenomegaly.  Bowel sounds are present.  No CVA or flank tenderness.  Extremities:  No edema.  Skin:  No rash present.    UC Treatments / Results  Labs (all labs ordered are listed,  but only abnormal results are displayed) Labs Reviewed  WOUND CULTURE  POCT RAPID STREP A (OFFICE) negative  Tympanometry:  Right ear tympanogram normal; Left ear tympanogram normal.  EKG  EKG Interpretation None       Radiology No results found.  Procedures Procedures (including critical care time)  Medications Ordered in UC Medications - No data to display   Initial Impression / Assessment and Plan / UC Course  I have reviewed the triage vital signs and the nursing notes.  Pertinent labs & imaging results that were available during my care of the patient were reviewed by me and considered in my medical decision making (see chart for details).    ? False negative rapid strep test. Begin empiric pen VK 500mg  BID May take Ibuprofen 200mg , 4 tabs every 8 hours with food, as needed for sore throat. Followup with ENT if not improved 10 days.    Final Clinical Impressions(s) / UC Diagnoses   Final diagnoses:     Acute pharyngitis, unspecified etiology    New Prescriptions New Prescriptions   PENICILLIN V POTASSIUM (VEETID) 500 MG TABLET    Take one tab by mouth twice daily for 10 days     Lattie Haw, MD 05/10/17 [phone]

## 2017-05-05 NOTE — ED Triage Notes (Signed)
Patient c/o 3 weeks of right ear pain. Denies drainage or fever or congestion.

## 2017-05-05 NOTE — ED Notes (Signed)
DISREGARD WOULD CULTURE ORDERED. ORDER PLACED ON WRONG PATIENT -KL

## 2017-07-01 ENCOUNTER — Encounter: Payer: Self-pay | Admitting: Sports Medicine

## 2017-07-01 ENCOUNTER — Ambulatory Visit (INDEPENDENT_AMBULATORY_CARE_PROVIDER_SITE_OTHER): Payer: BLUE CROSS/BLUE SHIELD | Admitting: Sports Medicine

## 2017-07-01 DIAGNOSIS — H9190 Unspecified hearing loss, unspecified ear: Secondary | ICD-10-CM | POA: Insufficient documentation

## 2017-07-01 DIAGNOSIS — M722 Plantar fascial fibromatosis: Secondary | ICD-10-CM | POA: Diagnosis not present

## 2017-07-01 DIAGNOSIS — F411 Generalized anxiety disorder: Secondary | ICD-10-CM | POA: Diagnosis not present

## 2017-07-01 DIAGNOSIS — Z0000 Encounter for general adult medical examination without abnormal findings: Secondary | ICD-10-CM | POA: Diagnosis not present

## 2017-07-01 DIAGNOSIS — H9193 Unspecified hearing loss, bilateral: Secondary | ICD-10-CM | POA: Diagnosis not present

## 2017-07-01 DIAGNOSIS — R5382 Chronic fatigue, unspecified: Secondary | ICD-10-CM

## 2017-07-01 NOTE — Assessment & Plan Note (Signed)
Has multiple diagnoses at this point, we tried essentially everything, he has also seen orthopedic foot/ankle surgery as well as podiatry. Neurology has seen him, nerve conduction and EMG was negative. We are going to just leave the foot alone for now.

## 2017-07-01 NOTE — Assessment & Plan Note (Signed)
Unclear etiology, doing a blood panel.

## 2017-07-01 NOTE — Progress Notes (Signed)
  Subjective:    CC: Annual physical  HPI:  Here for a physical. He is fasting.  Heel pain: Has seen multiple providers, no answer yet. See below.  Vague chronic fatigue: Significant anxiety, would like median some routine blood work regarding his fatigue.  Rapid hearing loss: Has seen audiology, now has bilateral hearing aids.  Past medical history:  Negative.  See flowsheet/record as well for more information.  Surgical history: Negative.  See flowsheet/record as well for more information.  Family history: Negative.  See flowsheet/record as well for more information.  Social history: Negative.  See flowsheet/record as well for more information.  Allergies, and medications have been entered into the medical record, reviewed, and no changes needed.    Review of Systems: No headache, visual changes, nausea, vomiting, diarrhea, constipation, dizziness, abdominal pain, skin rash, fevers, chills, night sweats, swollen lymph nodes, weight loss, chest pain, body aches, joint swelling, muscle aches, shortness of breath, mood changes, visual or auditory hallucinations.  Objective:    General: Well Developed, well nourished, and in no acute distress.  Neuro: Alert and oriented x3, extra-ocular muscles intact, sensation grossly intact. Cranial nerves II through XII are intact, motor, sensory, and coordinative functions are all intact. HEENT: Normocephalic, atraumatic, pupils equal round reactive to light, neck supple, no masses, no lymphadenopathy, thyroid nonpalpable. Oropharynx, nasopharynx, external ear canals are unremarkable. Skin: Warm and dry, no rashes noted.  Cardiac: Regular rate and rhythm, no murmurs rubs or gallops.  Respiratory: Clear to auscultation bilaterally. Not using accessory muscles, speaking in full sentences.  Abdominal: Soft, nontender, nondistended, positive bowel sounds, no masses, no organomegaly.  Musculoskeletal: Shoulder, elbow, wrist, hip, knee, ankle stable, and  with full range of motion.  Impression and Recommendations:    The patient was counselled, risk factors were discussed, anticipatory guidance given.  Annual physical exam Annual physical as above, routine blood work ordered.  Chronic fatigue Unclear etiology, doing a blood panel.  Plantar fasciitis, right Has multiple diagnoses at this point, we tried essentially everything, he has also seen orthopedic foot/ankle surgery as well as podiatry. Neurology has seen him, nerve conduction and EMG was negative. We are going to just leave the foot alone for now.  Hearing loss Rapid onset, has hearing aids, working with audiology. Ultimately we may pursue brain MRI.  Generalized anxiety disorder Adding half a milligram alprazolam, up to #30 per month. He can call me for refills. Still has some further refills left from a previous provider.

## 2017-07-01 NOTE — Assessment & Plan Note (Signed)
Rapid onset, has hearing aids, working with audiology. Ultimately we may pursue brain MRI.

## 2017-07-01 NOTE — Assessment & Plan Note (Signed)
Annual physical as above, routine blood work ordered. 

## 2017-07-01 NOTE — Assessment & Plan Note (Signed)
Adding half a milligram alprazolam, up to #30 per month. He can call me for refills. Still has some further refills left from a previous provider.

## 2017-07-02 LAB — CBC
HCT: 48.3 % (ref 38.5–50.0)
Hemoglobin: 16.4 g/dL (ref 13.2–17.1)
MCH: 30 pg (ref 27.0–33.0)
MCHC: 34 g/dL (ref 32.0–36.0)
MCV: 88.3 fL (ref 80.0–100.0)
MPV: 9.9 fL (ref 7.5–12.5)
Platelets: 210 10*3/uL (ref 140–400)
RBC: 5.47 MIL/uL (ref 4.20–5.80)
RDW: 12.9 % (ref 11.0–15.0)
WBC: 5.9 K/uL (ref 3.8–10.8)

## 2017-07-02 LAB — TESTOSTERONE TOTAL,FREE,BIO, MALES
Albumin: 4.3 g/dL (ref 3.6–5.1)
Sex Hormone Binding: 20 nmol/L (ref 10–50)
Testosterone: 239 ng/dL — ABNORMAL LOW (ref 250–827)

## 2017-07-02 LAB — LIPID PANEL W/REFLEX DIRECT LDL
Cholesterol: 222 mg/dL — ABNORMAL HIGH (ref <200)
HDL: 42 mg/dL (ref >40)
LDL-Cholesterol: 145 mg/dL — ABNORMAL HIGH
Non-HDL Cholesterol (Calc): 180 mg/dL — ABNORMAL HIGH (ref <130)
Total CHOL/HDL Ratio: 5.3 ratio — ABNORMAL HIGH (ref <5.0)
Triglycerides: 212 mg/dL — ABNORMAL HIGH (ref <150)

## 2017-07-02 LAB — COMPREHENSIVE METABOLIC PANEL WITH GFR
AST: 22 U/L (ref 10–40)
CO2: 25 mmol/L (ref 20–32)
Calcium: 9.3 mg/dL (ref 8.6–10.3)
Glucose, Bld: 99 mg/dL (ref 65–99)
Total Bilirubin: 0.6 mg/dL (ref 0.2–1.2)

## 2017-07-02 LAB — HEMOGLOBIN A1C
Hgb A1c MFr Bld: 5 % (ref <5.7)
Mean Plasma Glucose: 97 mg/dL

## 2017-07-02 LAB — COMPREHENSIVE METABOLIC PANEL
ALT: 36 U/L (ref 9–46)
Albumin: 4.3 g/dL (ref 3.6–5.1)
Alkaline Phosphatase: 95 U/L (ref 40–115)
BUN: 15 mg/dL (ref 7–25)
Chloride: 101 mmol/L (ref 98–110)
Creat: 1.06 mg/dL (ref 0.60–1.35)
Potassium: 4.3 mmol/L (ref 3.5–5.3)
Sodium: 137 mmol/L (ref 135–146)
Total Protein: 6.6 g/dL (ref 6.1–8.1)

## 2017-07-02 LAB — ANA: Anti Nuclear Antibody(ANA): NEGATIVE

## 2017-07-02 LAB — LYME AB/WESTERN BLOT REFLEX: B burgdorferi Ab IgG+IgM: <0.9 Index (ref <0.90)

## 2017-07-02 LAB — ROCKY MTN SPOTTED FVR ABS PNL(IGG+IGM)
RMSF IgG: NOT DETECTED
RMSF IgM: NOT DETECTED

## 2017-07-02 LAB — VITAMIN D 25 HYDROXY (VIT D DEFICIENCY, FRACTURES): Vit D, 25-Hydroxy: 50 ng/mL (ref 30–100)

## 2017-07-02 LAB — TSH: TSH: 1.12 m[IU]/L (ref 0.40–4.50)

## 2017-07-02 LAB — CK: Total CK: 80 U/L (ref 44–196)

## 2017-07-02 LAB — HIV ANTIBODY (ROUTINE TESTING W REFLEX): HIV 1&2 Ab, 4th Generation: NONREACTIVE

## 2017-07-02 LAB — SEDIMENTATION RATE: Sed Rate: 1 mm/hr (ref 0–15)

## 2017-07-03 LAB — HEAVY METALS PANEL, BLOOD
Arsenic: <3 mcg/L (ref <23)
Lead: 1 ug/dL (ref <5)
Mercury, B: <4 mcg/L (ref <=10)

## 2017-07-04 LAB — EHRLICHIA ANTIBODY PANEL
E chaffeensis (HGE) Ab, IgG: <1:64 {titer}
E chaffeensis (HGE) Ab, IgM: <1:20 {titer}

## 2017-07-10 ENCOUNTER — Encounter: Payer: Self-pay | Admitting: Sports Medicine

## 2017-08-11 ENCOUNTER — Encounter: Payer: Self-pay | Admitting: Sports Medicine

## 2017-08-13 MED ORDER — FLECTOR 1.3 % TD PTCH: 1.0000 | MEDICATED_PATCH | Freq: Two times a day (BID) | TRANSDERMAL | 11 refills | Status: DC

## 2017-08-13 MED ORDER — ALPRAZOLAM 0.5 MG PO TBDP: 0.5000 mg | ORAL_TABLET | Freq: Every day | ORAL | 0 refills | Status: DC | PRN

## 2017-08-14 MED ORDER — ALPRAZOLAM 0.5 MG PO TABS: 0.5000 mg | ORAL_TABLET | Freq: Every day | ORAL | 0 refills | Status: DC | PRN

## 2017-08-14 NOTE — Addendum Note (Signed)
Addended by: Monica Becton on: 08/14/2017 05:04 PM   Modules accepted: Orders

## 2017-09-29 ENCOUNTER — Other Ambulatory Visit: Payer: Self-pay | Admitting: Sports Medicine

## 2017-11-05 ENCOUNTER — Other Ambulatory Visit: Payer: Self-pay | Admitting: Sports Medicine

## 2017-11-27 ENCOUNTER — Other Ambulatory Visit: Payer: Self-pay | Admitting: Sports Medicine

## 2017-12-22 ENCOUNTER — Other Ambulatory Visit: Payer: Self-pay | Admitting: Sports Medicine

## 2018-01-23 ENCOUNTER — Other Ambulatory Visit: Payer: Self-pay | Admitting: Sports Medicine

## 2018-02-05 ENCOUNTER — Encounter: Payer: Self-pay | Admitting: Sports Medicine

## 2018-02-05 ENCOUNTER — Ambulatory Visit (INDEPENDENT_AMBULATORY_CARE_PROVIDER_SITE_OTHER): Payer: BLUE CROSS/BLUE SHIELD | Admitting: Sports Medicine

## 2018-02-05 DIAGNOSIS — E291 Testicular hypofunction: Secondary | ICD-10-CM

## 2018-02-05 DIAGNOSIS — E781 Pure hyperglyceridemia: Secondary | ICD-10-CM

## 2018-02-05 DIAGNOSIS — M47816 Spondylosis without myelopathy or radiculopathy, lumbar region: Secondary | ICD-10-CM

## 2018-02-05 DIAGNOSIS — Z0000 Encounter for general adult medical examination without abnormal findings: Secondary | ICD-10-CM | POA: Diagnosis not present

## 2018-02-05 DIAGNOSIS — I10 Essential (primary) hypertension: Secondary | ICD-10-CM | POA: Diagnosis not present

## 2018-02-05 NOTE — Assessment & Plan Note (Signed)
Rechecking labs 

## 2018-02-05 NOTE — Progress Notes (Signed)
Subjective:    CC: Annual physical  HPI:  Colin Carter is here for his physical, he really does not have any complaints.  I reviewed the past medical history, family history, social history, surgical history, and allergies today and no changes were needed.  Please see the problem list section below in epic for further details.  Past Medical History: No past medical history on file. Past Surgical History: No past surgical history on file. Social History: Social History   Socioeconomic History  . Marital status: Married    Spouse name: Not on file  . Number of children: Not on file  . Years of education: Not on file  . Highest education level: Not on file  Occupational History  . Not on file  Social Needs  . Financial resource strain: Not on file  . Food insecurity:    Worry: Not on file    Inability: Not on file  . Transportation needs:    Medical: Not on file    Non-medical: Not on file  Tobacco Use  . Smoking status: Never Smoker  . Smokeless tobacco: Never Used  Substance and Sexual Activity  . Alcohol use: No  . Drug use: No  . Sexual activity: Not on file  Lifestyle  . Physical activity:    Days per week: Not on file    Minutes per session: Not on file  . Stress: Not on file  Relationships  . Social connections:    Talks on phone: Not on file    Gets together: Not on file    Attends religious service: Not on file    Active member of club or organization: Not on file    Attends meetings of clubs or organizations: Not on file    Relationship status: Not on file  Other Topics Concern  . Not on file  Social History Narrative  . Not on file   Family History: Family History  Problem Relation Age of Onset  . Heart attack Father    Allergies: No Known Allergies Medications: See med rec.  Review of Systems: No headache, visual changes, nausea, vomiting, diarrhea, constipation, dizziness, abdominal pain, skin rash, fevers, chills, night sweats, swollen lymph  nodes, weight loss, chest pain, body aches, joint swelling, muscle aches, shortness of breath, mood changes, visual or auditory hallucinations.  Objective:    General: Well Developed, well nourished, and in no acute distress.  Neuro: Alert and oriented x3, extra-ocular muscles intact, sensation grossly intact. Cranial nerves II through XII are intact, motor, sensory, and coordinative functions are all intact. HEENT: Normocephalic, atraumatic, pupils equal round reactive to light, neck supple, no masses, no lymphadenopathy, thyroid nonpalpable. Oropharynx, nasopharynx, external ear canals are unremarkable. Skin: Warm and dry, no rashes noted.  Cardiac: Regular rate and rhythm, no murmurs rubs or gallops.  Respiratory: Clear to auscultation bilaterally. Not using accessory muscles, speaking in full sentences.  Abdominal: Soft, nontender, nondistended, positive bowel sounds, no masses, no organomegaly.  Musculoskeletal: Shoulder, elbow, wrist, hip, knee, ankle stable, and with full range of motion.  Impression and Recommendations:    The patient was counselled, risk factors were discussed, anticipatory guidance given.  Annual physical exam Routine physical as above. Checking labs, declines Tdap and flu shots.  Hypertension Controlled, no changes, checking labs.  Hypertriglyceridemia Rechecking labs.  Lumbar spondylosis Still never got the lumbar epidural. Next step would be to consider Cymbalta, he is going to think about it.  He does have significant anxiety at baseline.  Male hypogonadism  Rechecking testosterone levels. He will consider replacement if still low. ___________________________________________ Colin Carter. Colin Carter, M.D., ABFM., CAQSM. Primary Care and Sports Medicine [facility]  Adjunct Instructor of Family Medicine  [employer]

## 2018-02-05 NOTE — Assessment & Plan Note (Signed)
Rechecking testosterone levels. He will consider replacement if still low.

## 2018-02-05 NOTE — Assessment & Plan Note (Signed)
Routine physical as above. Checking labs, declines Tdap and flu shots.

## 2018-02-05 NOTE — Assessment & Plan Note (Signed)
Still never got the lumbar epidural. Next step would be to consider Cymbalta, he is going to think about it.  He does have significant anxiety at baseline.

## 2018-02-05 NOTE — Assessment & Plan Note (Signed)
Controlled, no changes, checking labs.

## 2018-02-09 ENCOUNTER — Other Ambulatory Visit: Payer: Self-pay | Admitting: Emergency Medicine

## 2018-02-09 DIAGNOSIS — Z09 Encounter for follow-up examination after completed treatment for conditions other than malignant neoplasm: Secondary | ICD-10-CM

## 2018-02-11 LAB — TESTOSTERONE, FREE & TOTAL
Free Testosterone: 65.1 pg/mL (ref 35.0–155.0)
Testosterone, Total, LC-MS-MS: 332 ng/dL (ref 250–1100)

## 2018-02-11 LAB — COMPREHENSIVE METABOLIC PANEL WITH GFR
Albumin: 4.3 g/dL (ref 3.6–5.1)
Alkaline phosphatase (APISO): 93 U/L (ref 40–115)
BUN: 17 mg/dL (ref 7–25)
Chloride: 102 mmol/L (ref 98–110)
Creat: 1.12 mg/dL (ref 0.60–1.35)
Globulin: 2.4 g/dL (ref 1.9–3.7)
Potassium: 4.3 mmol/L (ref 3.5–5.3)
Sodium: 136 mmol/L (ref 135–146)
Total Protein: 6.7 g/dL (ref 6.1–8.1)

## 2018-02-11 LAB — CBC
HCT: 45.7 % (ref 38.5–50.0)
Hemoglobin: 15.6 g/dL (ref 13.2–17.1)
MCH: 28 pg (ref 27.0–33.0)
MCHC: 34.1 g/dL (ref 32.0–36.0)
MCV: 81.9 fL (ref 80.0–100.0)
MPV: 10.1 fL (ref 7.5–12.5)
Platelets: 223 Thousand/uL (ref 140–400)
RBC: 5.58 Million/uL (ref 4.20–5.80)
RDW: 12.9 % (ref 11.0–15.0)
WBC: 6 Thousand/uL (ref 3.8–10.8)

## 2018-02-11 LAB — COMPREHENSIVE METABOLIC PANEL
AG Ratio: 1.8 (calc) (ref 1.0–2.5)
ALT: 50 U/L — ABNORMAL HIGH (ref 9–46)
AST: 28 U/L (ref 10–40)
CO2: 30 mmol/L (ref 20–32)
Calcium: 9.3 mg/dL (ref 8.6–10.3)
Glucose, Bld: 107 mg/dL — ABNORMAL HIGH (ref 65–99)
Total Bilirubin: 0.7 mg/dL (ref 0.2–1.2)

## 2018-02-11 LAB — LIPID PANEL W/REFLEX DIRECT LDL
Cholesterol: 222 mg/dL — ABNORMAL HIGH (ref <200)
HDL: 45 mg/dL (ref >40)
LDL Cholesterol (Calc): 145 mg/dL — ABNORMAL HIGH
Non-HDL Cholesterol (Calc): 177 mg/dL (calc) — ABNORMAL HIGH (ref <130)
Total CHOL/HDL Ratio: 4.9 (calc) (ref <5.0)
Triglycerides: 180 mg/dL — ABNORMAL HIGH (ref <150)

## 2018-02-24 ENCOUNTER — Other Ambulatory Visit: Payer: Self-pay | Admitting: Sports Medicine

## 2018-03-11 ENCOUNTER — Encounter: Payer: Self-pay | Admitting: Sports Medicine

## 2018-03-11 DIAGNOSIS — H9193 Unspecified hearing loss, bilateral: Secondary | ICD-10-CM

## 2018-03-11 NOTE — Assessment & Plan Note (Signed)
Intermittent hearing loss, this is likely due to presbycusis.  Referral again to audiology and then ENT if needed. In the past we had suggested brain MRI should this persist.

## 2018-03-27 ENCOUNTER — Other Ambulatory Visit: Payer: Self-pay | Admitting: Sports Medicine

## 2018-04-27 ENCOUNTER — Other Ambulatory Visit: Payer: Self-pay | Admitting: Sports Medicine

## 2018-05-26 ENCOUNTER — Other Ambulatory Visit: Payer: Self-pay | Admitting: Sports Medicine

## 2018-05-26 NOTE — Telephone Encounter (Signed)
[facility] requesting RF on Xanax.  Last RF sent 04-27-18  RX pended, please review and send if appropriate  Thanks!

## 2018-06-03 ENCOUNTER — Encounter: Payer: Self-pay | Admitting: Sports Medicine

## 2018-06-10 IMAGING — MR MR FOOT*R* W/O CM
5 series · 40 of 40 positions shown · non-contrast
Comparison: ]:
COMPARISON: ]
Radiograph 06/10/2016

CLINICAL DATA: Six- week history of heel pain.

EXAM:
MRI OF THE RIGHT FOREFOOT WITHOUT CONTRAST
TECHNIQUE: Multiplanar, multisequence MR imaging of the ankle was performed. No
intravenous contrast was administered.

[Series 4: PD fat-sat · axial · 3.0mm · 0.62mm/px · z∈[-99,+30]mm · 9 of 40 slices shown]
[im 1/40]
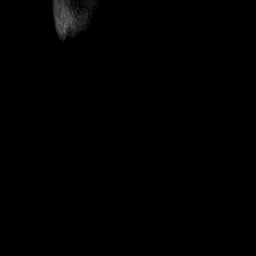
[im 5/40]
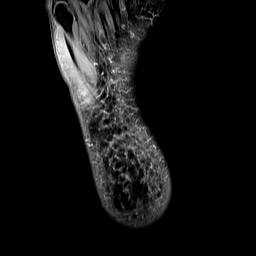
[im 10/40]
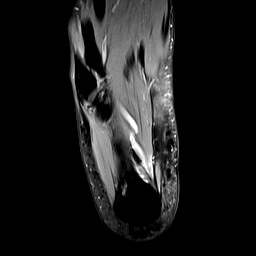
[im 15/40]
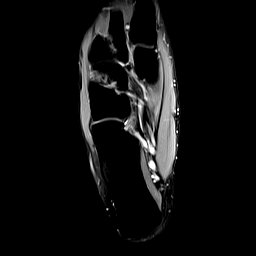
[im 20/40]
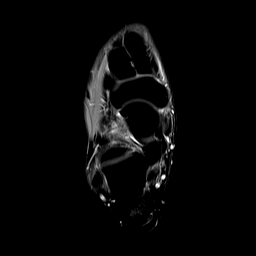
[im 25/40]
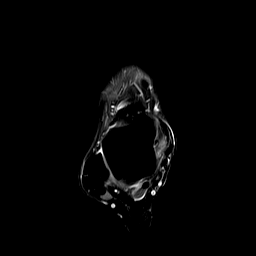
[im 30/40]
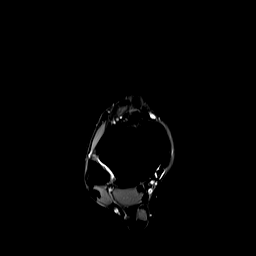
[im 35/40]
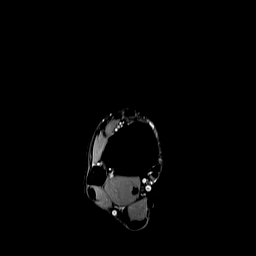
[im 40/40]
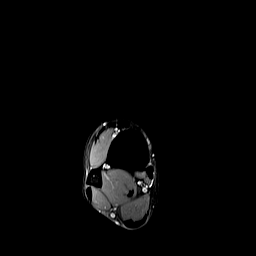

[Series 5: T2 fat-sat · axial · 3.0mm · 0.62mm/px · z∈[-99,+30]mm · 9 of 40 slices shown (1 of 2)]
[im 1/40]
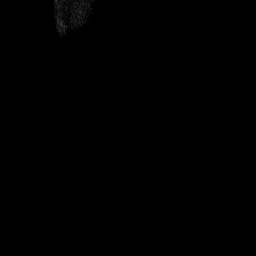
[im 5/40]
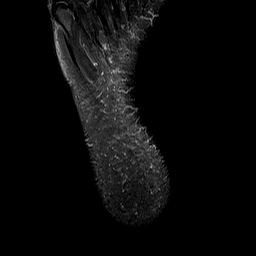
[im 10/40]
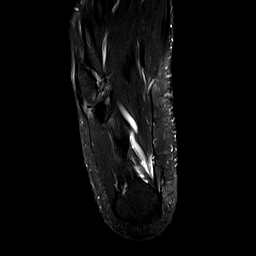
[im 15/40]
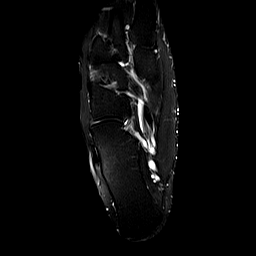
[im 20/40]
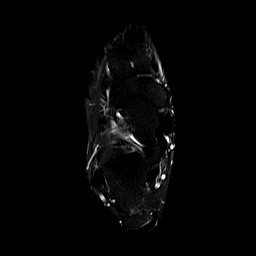
[im 25/40]
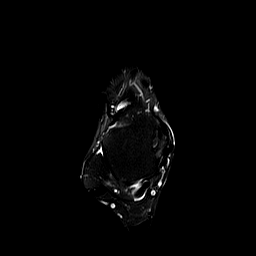
[im 30/40]
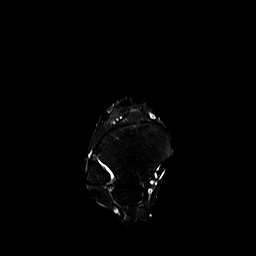
[im 35/40]
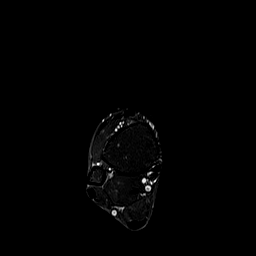
[im 40/40]
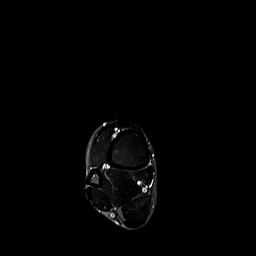

[Series 6: T2 fat-sat · coronal · 3.0mm · 0.70mm/px · 10 of 45 slices shown (2 of 2)]
[im 1/45]
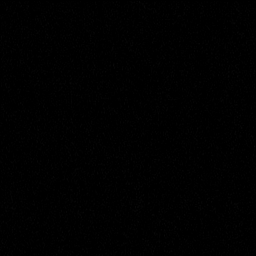
[im 5/45]
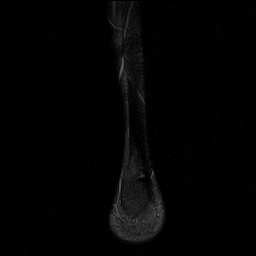
[im 10/45]
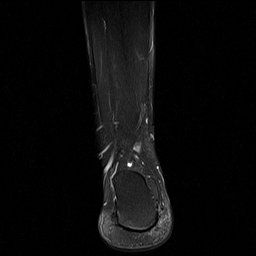
[im 15/45]
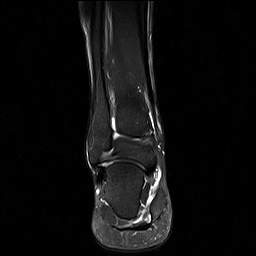
[im 20/45]
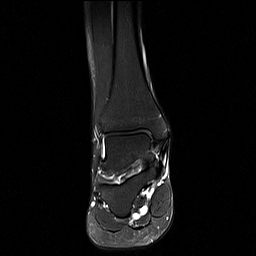
[im 25/45]
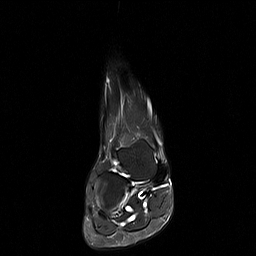
[im 30/45]
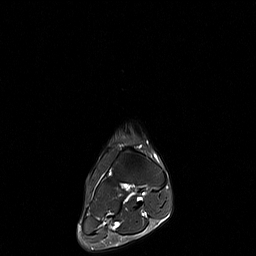
[im 35/45]
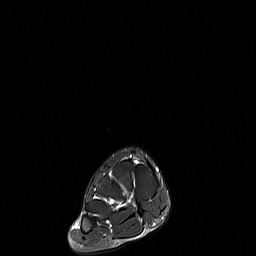
[im 40/45]
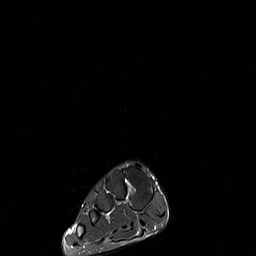
[im 45/45]
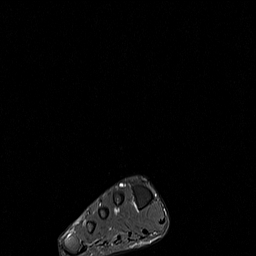

[Series 7: T1 · sagittal · 3.0mm · 0.47mm/px · 6 of 27 slices shown]
[im 1/27]
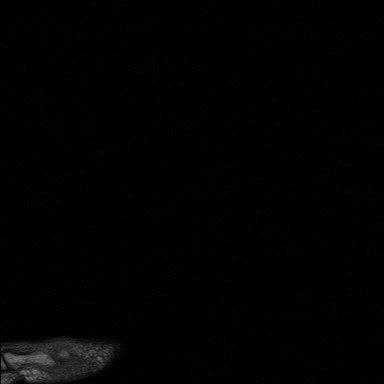
[im 6/27]
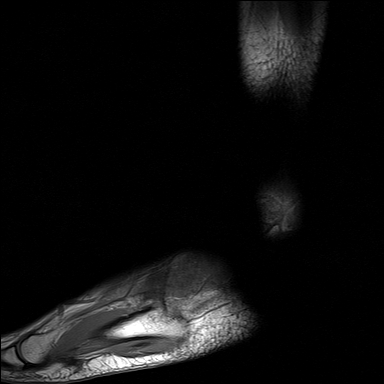
[im 11/27]
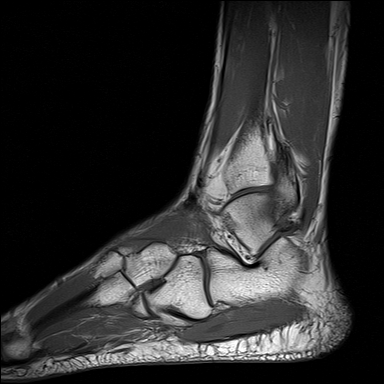
[im 16/27]
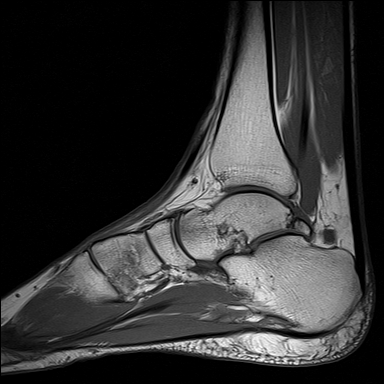
[im 21/27]
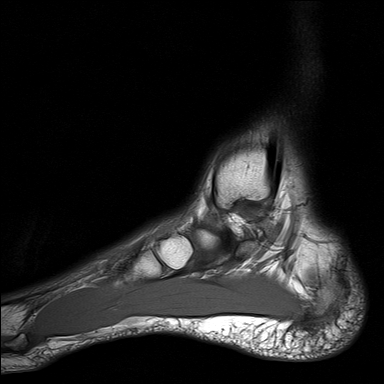
[im 27/27]
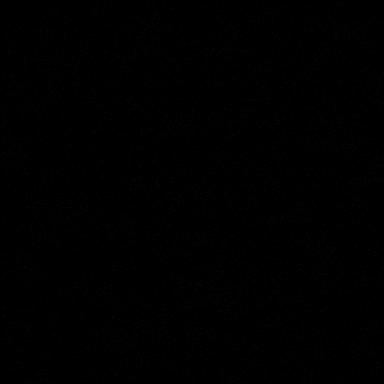

[Series 8: STIR · sagittal · 3.0mm · 0.70mm/px · 6 of 27 slices shown]
[im 1/27]
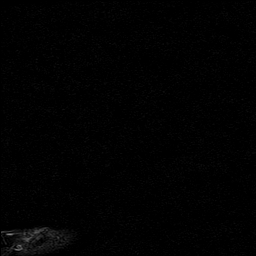
[im 6/27]
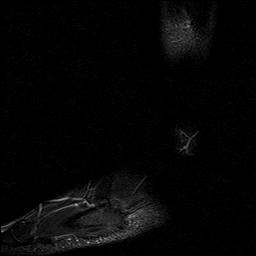
[im 11/27]
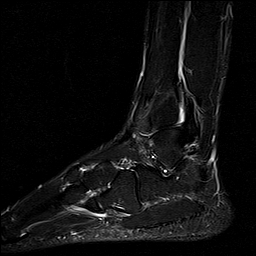
[im 16/27]
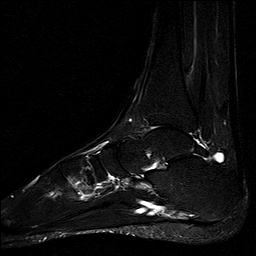
[im 21/27]
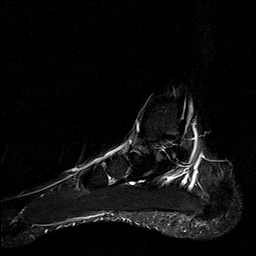
[im 27/27]
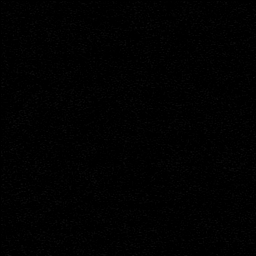

[40 of 40 positions shown; findings below may reference images not displayed]

FINDINGS: TENDONS

Peroneal: Intact

Posteromedial: Intact. Mild tenosynovitis involving the posterior
tibialis tendon and flexor digitorum longus tendon.

Anterior: Normal.

Achilles: Normal.

Plantar Fascia: Findings consistent plantar fasciitis. There is a
small calcaneal heel spur and inflammations/edema mainly involving
the central slip and flexor digitorum brevis muscle. No tear/
rupture.

LIGAMENTS

Lateral: Intact

Medial: Intact

CARTILAGE

Ankle Joint: The tibiotalar joint is maintained. No chondral defects
or osteochondral lesion.

Subtalar Joints/Sinus Tarsi: The subtalar joints are maintained.
Chronic knee pain. The sinus tarsi is normal. The cervical and
interosseous ligaments are intact the spring ligament appears
normal.

Bones: Incidental os trigonum but no inflammations/edema. No stress
fracture or AVN.

Other: None
IMPRESSION: 1. Findings consistent with plantar fasciitis. Small associated
calcaneal heel spur.
2. Intact medial and lateral ankle ligaments and tendons.
3. Normal Achilles tendon.
4. No findings for calcaneal stress fracture.

## 2018-06-22 ENCOUNTER — Other Ambulatory Visit: Payer: Self-pay | Admitting: Sports Medicine

## 2018-07-14 ENCOUNTER — Other Ambulatory Visit: Payer: Self-pay | Admitting: Sports Medicine

## 2018-08-10 ENCOUNTER — Other Ambulatory Visit: Payer: Self-pay | Admitting: Sports Medicine

## 2018-08-13 ENCOUNTER — Encounter: Payer: Self-pay | Admitting: Sports Medicine

## 2018-08-13 MED ORDER — ALPRAZOLAM 0.5 MG PO TABS: 0.5000 mg | ORAL_TABLET | Freq: Two times a day (BID) | ORAL | 0 refills | Status: DC | PRN

## 2018-09-20 ENCOUNTER — Other Ambulatory Visit: Payer: Self-pay | Admitting: *Deleted

## 2018-09-20 MED ORDER — LISINOPRIL 20 MG PO TABS: 20.0000 mg | ORAL_TABLET | Freq: Every day | ORAL | 3 refills | Status: DC

## 2018-10-21 ENCOUNTER — Other Ambulatory Visit: Payer: Self-pay | Admitting: Sports Medicine

## 2018-10-21 MED ORDER — ALPRAZOLAM 0.5 MG PO TABS: 0.5000 mg | ORAL_TABLET | Freq: Two times a day (BID) | ORAL | 0 refills | Status: DC | PRN

## 2018-10-29 ENCOUNTER — Encounter: Payer: Self-pay | Admitting: Sports Medicine

## 2018-11-27 ENCOUNTER — Encounter: Payer: Self-pay | Admitting: Emergency Medicine

## 2018-11-27 ENCOUNTER — Emergency Department (INDEPENDENT_AMBULATORY_CARE_PROVIDER_SITE_OTHER)
Admission: EM | Admit: 2018-11-27 | Discharge: 2018-11-27 | Disposition: A | Discharge status: Home / Self Care | Payer: Self-pay | Source: Home / Self Care | Attending: Family Medicine | Admitting: Family Medicine

## 2018-11-27 ENCOUNTER — Other Ambulatory Visit: Payer: Self-pay

## 2018-11-27 DIAGNOSIS — L0100 Impetigo, unspecified: Secondary | ICD-10-CM

## 2018-11-27 DIAGNOSIS — K130 Diseases of lips: Secondary | ICD-10-CM

## 2018-11-27 MED ORDER — MUPIROCIN 2 % EX OINT: TOPICAL_OINTMENT | CUTANEOUS | 0 refills | Status: DC

## 2018-11-27 MED ORDER — DOXYCYCLINE HYCLATE 100 MG PO CAPS: 100.0000 mg | ORAL_CAPSULE | Freq: Two times a day (BID) | ORAL | 0 refills | Status: DC

## 2018-11-27 NOTE — Discharge Instructions (Signed)
°  Keep area clean with warm water and mild soap. Apply the antibiotic ointment as prescribed and be sure to also take the oral antibiotic for the full 7 days as prescribed as well to help prevent infection from returning.  Please follow up with family medicine or return to urgent care if needed if not improving in 1 week.

## 2018-11-27 NOTE — ED Triage Notes (Signed)
Here with right nasal bump. Started x4 dys ago Numbness, sharp achy pain noted.

## 2018-11-27 NOTE — ED Provider Notes (Signed)
[facility]    CSN: [id number] Arrival date & time: 11/27/18  0908     History   Chief Complaint Chief Complaint  Patient presents with  . Nasal Polyps    HPI Colin MadWesley Blank is a 48 y.o. male.   HPI Colin Carter is a 48 y.o. male presenting to UC with c/o 4 days worsening red painful sore on the inside of his Right nostril. Pain, swelling and redness is radiating into his upper lip.  Pain is moderately sharp and achy. No fever or chills. He had a recent head cold. No treatments tried PTA.    History reviewed. No pertinent past medical history.  Patient Active Problem List   Diagnosis Date Noted  . 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 Chronic fatigue 07/01/2017  . Hearing loss 07/01/2017  . Hypertriglyceridemia 06/05/2016  . Plantar fasciitis, right 06/05/2015  . Generalized anxiety disorder 09/19/2014  . Hypertension 06/23/2014  . Annual physical exam 03/10/2014  . Lumbar spondylosis 03/10/2014    History reviewed. No pertinent surgical history.     Home Medications    Prior to Admission medications   Medication Sig Start Date End Date Taking? Authorizing Provider  ALPRAZolam Prudy Feeler(XANAX) 0.5 MG tablet Take 1 tablet (0.5 mg total) by mouth 2 (two) times daily as needed for anxiety. 10/21/18   Monica Bectonhekkekandam, Thomas J, MD  doxycycline (VIBRAMYCIN) 100 MG capsule Take 1 capsule (100 mg total) by mouth 2 (two) times daily. One po bid x 7 days 11/27/18   Lurene ShadowPhelps, Charliegh Vasudevan O, PA-C  [facility] 1.3 % [facility] 1 patch onto the skin 2 (two) times daily. Patient not taking: Reported on 02/05/2018 08/13/17   Monica Bectonhekkekandam, Thomas J, MD  lisinopril (PRINIVIL,ZESTRIL) 20 MG tablet Take 1 tablet (20 mg total) by mouth daily. 09/20/18   Monica Bectonhekkekandam, Thomas J, MD  mupirocin ointment Idelle Jo(BACTROBAN) 2 % Apply to wound 3 times daily for 5 days 11/27/18   Lurene ShadowPhelps, [place] Bueche O, PA-C    Family History Family History  Problem Relation Age of Onset  . Heart attack Father     Social History Social  History   Tobacco Use  . Smoking status: Never Smoker  . Smokeless tobacco: Never Used  Substance Use Topics  . Alcohol use: No  . Drug use: No     Allergies   Patient has no known allergies.   Review of Systems Review of Systems  Constitutional: Negative for chills and fever.  HENT: Positive for congestion and rhinorrhea. Negative for ear pain and sore throat.        Right nostril sore  Respiratory: Negative for cough.   Neurological: Positive for headaches. Negative for dizziness and light-headedness.     Physical Exam Triage Vital Signs ED Triage Vitals  Enc Vitals Group     BP 11/27/18 0929 118/79     Pulse Rate 11/27/18 0929 60     Resp --      Temp 11/27/18 0929 97.8 F (36.6 C)     Temp Source 11/27/18 0929 Oral     SpO2 11/27/18 0929 97 %     Weight 11/27/18 0930 167 lb (75.8 kg)     Height 11/27/18 0930 5\' 8"  (1.727 m)     Head Circumference --      Peak Flow --      Pain Score 11/27/18 0930 6     Pain Loc --      Pain Edu? --      Excl. in GC? --  No data found.  Updated Vital Signs BP 118/79 (BP Location: Left Arm)   Pulse 60   Temp 97.8 F (36.6 C) (Oral)   Ht 5\' 8"  (1.727 m)   Wt 167 lb (75.8 kg)   SpO2 97%   BMI 25.39 kg/m   Visual Acuity Right Eye Distance:   Left Eye Distance:   Bilateral Distance:    Right Eye Near:   Left Eye Near:    Bilateral Near:     Physical Exam Vitals signs and nursing note reviewed.  Constitutional:      Appearance: He is well-developed.  HENT:     Head: Normocephalic and atraumatic.     Right Ear: Tympanic membrane normal.     Left Ear: Tympanic membrane normal.     Nose: Nasal tenderness and congestion present.     Right Sinus: No maxillary sinus tenderness or frontal sinus tenderness.     Left Sinus: No maxillary sinus tenderness or frontal sinus tenderness.   Neck:     Musculoskeletal: Normal range of motion.  Cardiovascular:     Rate and Rhythm: Normal rate.  Pulmonary:     Effort:  Pulmonary effort is normal.  Musculoskeletal: Normal range of motion.  Skin:    General: Skin is warm and dry.  Neurological:     Mental Status: He is alert and oriented to person, place, and time.  Psychiatric:        Behavior: Behavior normal.      UC Treatments / Results  Labs (all labs ordered are listed, but only abnormal results are displayed) Labs Reviewed - No data to display  EKG None  Radiology No results found.  Procedures Procedures (including critical care time)  Medications Ordered in UC Medications - No data to display  Initial Impression / Assessment and Plan / UC Course  I have reviewed the triage vital signs and the nursing notes.  Pertinent labs & imaging results that were available during my care of the patient were reviewed by me and considered in my medical decision making (see chart for details).     Hx and exam c/w skin infection Will tx with topical and oral antibiotics given spreading of infection into upper lip.  Final Clinical Impressions(s) / UC Diagnoses   Final diagnoses:  Impetigo  Cellulitis of lip     Discharge Instructions      Keep area clean with warm water and mild soap. Apply the antibiotic ointment as prescribed and be sure to also take the oral antibiotic for the full 7 days as prescribed as well to help prevent infection from returning.  Please follow up with family medicine or return to urgent care if needed if not improving in 1 week.     ED Prescriptions    Medication Sig Dispense Auth. Provider   mupirocin ointment (BACTROBAN) 2 % Apply to wound 3 times daily for 5 days 22 g Harly Pipkins O, PA-C   doxycycline (VIBRAMYCIN) 100 MG capsule Take 1 capsule (100 mg total) by mouth 2 (two) times daily. One po bid x 7 days 14 capsule Lurene Shadow, [place]     Controlled Substance Prescriptions [place] Controlled Substance Registry consulted? Not Applicable   Rolla Plate 11/27/18 0814

## 2019-01-03 ENCOUNTER — Other Ambulatory Visit: Payer: Self-pay | Admitting: Sports Medicine

## 2019-01-30 ENCOUNTER — Encounter: Payer: Self-pay | Admitting: Sports Medicine

## 2019-01-31 MED ORDER — LISINOPRIL 20 MG PO TABS: 20.0000 mg | ORAL_TABLET | Freq: Every day | ORAL | 3 refills | Status: DC

## 2019-01-31 MED ORDER — ALPRAZOLAM 0.5 MG PO TABS: 0.5000 mg | ORAL_TABLET | Freq: Two times a day (BID) | ORAL | 3 refills | Status: DC | PRN

## 2019-03-29 ENCOUNTER — Other Ambulatory Visit: Payer: Self-pay | Admitting: Sports Medicine

## 2019-03-30 NOTE — Telephone Encounter (Signed)
Left Voicemail for patient to call and schedule appointment with Dr. Karie Schwalbe

## 2019-03-30 NOTE — Telephone Encounter (Signed)
Please contact Pt to schedule, thank you.  

## 2019-03-30 NOTE — Telephone Encounter (Signed)
I have not seen this patient in over a year.

## 2019-03-31 ENCOUNTER — Encounter: Payer: Self-pay | Admitting: Sports Medicine

## 2019-03-31 ENCOUNTER — Ambulatory Visit (INDEPENDENT_AMBULATORY_CARE_PROVIDER_SITE_OTHER): Payer: BLUE CROSS/BLUE SHIELD | Admitting: Sports Medicine

## 2019-03-31 DIAGNOSIS — F411 Generalized anxiety disorder: Secondary | ICD-10-CM

## 2019-03-31 DIAGNOSIS — E781 Pure hyperglyceridemia: Secondary | ICD-10-CM

## 2019-03-31 DIAGNOSIS — M47816 Spondylosis without myelopathy or radiculopathy, lumbar region: Secondary | ICD-10-CM | POA: Diagnosis not present

## 2019-03-31 MED ORDER — ALPRAZOLAM 0.5 MG PO TABS: 0.5000 mg | ORAL_TABLET | Freq: Two times a day (BID) | ORAL | 2 refills | Status: DC | PRN

## 2019-03-31 NOTE — Progress Notes (Signed)
Virtual Visit via Telephone   I connected with  Colin Carter  on 03/31/19 by telephone/telehealth and verified that I am speaking with the correct person using two identifiers.   I discussed the limitations, risks, security and privacy concerns of performing an evaluation and management service by telephone, including the higher likelihood of inaccurate diagnosis and treatment, and the availability of in person appointments.  We also discussed the likely need of an additional face to face encounter for complete and high quality delivery of care.  I also discussed with the patient that there may be a patient responsible charge related to this service. The patient expressed understanding and wishes to proceed.  Provider location is either at home or medical facility. Patient location is at their home, different from provider location. People involved in care of the patient during this telehealth encounter were myself, my nurse/medical assistant, and my front office/scheduling team member.  Subjective:    CC: Follow-up  HPI: Anxiety: Colin Carter is a 48 year old male with generalized anxiety disorder, he has done well in the past with alprazolam use to twice a day, we have not had a visit in some time.  No suicidal or homicidal ideation, medications working well, he does not request early refills.  Hyperlipidemia: Never proceeded with repeat lipids after dietary changes.  I reviewed the past medical history, family history, social history, surgical history, and allergies today and no changes were needed.  Please see the problem list section below in epic for further details.  Past Medical History: No past medical history on file. Past Surgical History: No past surgical history on file. Social History: Social History   Socioeconomic History  . Marital status: Married    Spouse name: Not on file  . Number of children: Not on file  . Years of education: Not on file  . Highest education  level: Not on file  Occupational History  . Not on file  Social Needs  . Financial resource strain: Not on file  . Food insecurity:    Worry: Not on file    Inability: Not on file  . Transportation needs:    Medical: Not on file    Non-medical: Not on file  Tobacco Use  . Smoking status: Never Smoker  . Smokeless tobacco: Never Used  Substance and Sexual Activity  . Alcohol use: No  . Drug use: No  . Sexual activity: Not on file  Lifestyle  . Physical activity:    Days per week: Not on file    Minutes per session: Not on file  . Stress: Not on file  Relationships  . Social connections:    Talks on phone: Not on file    Gets together: Not on file    Attends religious service: Not on file    Active member of club or organization: Not on file    Attends meetings of clubs or organizations: Not on file    Relationship status: Not on file  Other Topics Concern  . Not on file  Social History Narrative  . Not on file   Family History: Family History  Problem Relation Age of Onset  . Heart attack Father    Allergies: No Known Allergies Medications: See med rec.  Review of Systems: No fevers, chills, night sweats, weight loss, chest pain, or shortness of breath.   Objective:    General: Speaking full sentences, no audible heavy breathing.  Sounds alert and appropriately interactive.  No other physical exam performed due  to the non-face to face nature of this visit.  Impression and Recommendations:    Generalized anxiety disorder Doing well on alprazolam, number 60/month. Refilling medication with 3 refills.   Lumbar spondylosis L5-S1 DDD, never did the lumbar epidural. We will need a new MRI, he will discuss this with me when he is ready.  Hypertriglyceridemia Elevated lipids, he has been doing a low-cholesterol diet, adding another order for lipid panel. He plans to get this drawn sometime later this month, fasting.   I discussed the above assessment and  treatment plan with the patient. The patient was provided an opportunity to ask questions and all were answered. The patient agreed with the plan and demonstrated an understanding of the instructions.   The patient was advised to call back or seek an in-person evaluation if the symptoms worsen or if the condition fails to improve as anticipated.   I provided 25 minutes of non-face-to-face time during this encounter, 15 minutes of additional time was needed to gather information, review chart, records, communicate/coordinate with staff remotely, and complete documentation.   ___________________________________________ Ihor Austin. Benjamin Stain, M.D., ABFM., CAQSM. Primary Care and Sports Medicine [facility]  Adjunct Professor of Family Medicine  [employer]

## 2019-03-31 NOTE — Assessment & Plan Note (Signed)
Elevated lipids, he has been doing a low-cholesterol diet, adding another order for lipid panel. He plans to get this drawn sometime later this month, fasting.

## 2019-03-31 NOTE — Assessment & Plan Note (Signed)
L5-S1 DDD, never did the lumbar epidural. We will need a new MRI, he will discuss this with me when he is ready.

## 2019-03-31 NOTE — Assessment & Plan Note (Signed)
Doing well on alprazolam, number 60/month. Refilling medication with 3 refills.

## 2019-04-19 ENCOUNTER — Encounter: Payer: Self-pay | Admitting: Sports Medicine

## 2019-05-23 ENCOUNTER — Encounter: Payer: Self-pay | Admitting: Sports Medicine

## 2019-07-19 ENCOUNTER — Encounter: Payer: Self-pay | Admitting: Sports Medicine

## 2019-07-22 DIAGNOSIS — E781 Pure hyperglyceridemia: Secondary | ICD-10-CM | POA: Diagnosis not present

## 2019-07-23 LAB — COMPLETE METABOLIC PANEL WITH GFR
AG Ratio: 2 (calc) (ref 1.0–2.5)
ALT: 55 U/L — ABNORMAL HIGH (ref 9–46)
AST: 25 U/L (ref 10–40)
Albumin: 4.3 g/dL (ref 3.6–5.1)
Alkaline phosphatase (APISO): 92 U/L (ref 36–130)
BUN: 18 mg/dL (ref 7–25)
CO2: 27 mmol/L (ref 20–32)
Calcium: 9.6 mg/dL (ref 8.6–10.3)
Chloride: 102 mmol/L (ref 98–110)
Creat: 1.14 mg/dL (ref 0.60–1.35)
GFR, Est African American: 88 mL/min/{1.73_m2} (ref >=60)
GFR, Est Non African American: 76 mL/min/{1.73_m2} (ref >=60)
Globulin: 2.2 g/dL (calc) (ref 1.9–3.7)
Glucose, Bld: 96 mg/dL (ref 65–99)
Potassium: 4.6 mmol/L (ref 3.5–5.3)
Sodium: 137 mmol/L (ref 135–146)
Total Bilirubin: 0.7 mg/dL (ref 0.2–1.2)
Total Protein: 6.5 g/dL (ref 6.1–8.1)

## 2019-07-23 LAB — LIPID PANEL W/REFLEX DIRECT LDL
Cholesterol: 192 mg/dL (ref <200)
HDL: 48 mg/dL (ref >=40)
LDL Cholesterol (Calc): 121 mg/dL (calc) — ABNORMAL HIGH
Non-HDL Cholesterol (Calc): 144 mg/dL (calc) — ABNORMAL HIGH (ref <130)
Total CHOL/HDL Ratio: 4 (calc) (ref <5.0)
Triglycerides: 124 mg/dL (ref <150)

## 2019-07-23 LAB — HEMOGLOBIN A1C
Hgb A1c MFr Bld: 5.1 % of total Hgb (ref <5.7)
Mean Plasma Glucose: 100 (calc)
eAG (mmol/L): 5.5 (calc)

## 2019-07-23 LAB — CBC
HCT: 47.1 % (ref 38.5–50.0)
Hemoglobin: 15.8 g/dL (ref 13.2–17.1)
MCH: 28.9 pg (ref 27.0–33.0)
MCHC: 33.5 g/dL (ref 32.0–36.0)
MCV: 86.3 fL (ref 80.0–100.0)
MPV: 10 fL (ref 7.5–12.5)
Platelets: 237 10*3/uL (ref 140–400)
RBC: 5.46 10*6/uL (ref 4.20–5.80)
RDW: 11.9 % (ref 11.0–15.0)
WBC: 5.7 10*3/uL (ref 3.8–10.8)

## 2019-07-23 LAB — TSH: TSH: 1.31 mIU/L (ref 0.40–4.50)

## 2019-09-22 ENCOUNTER — Other Ambulatory Visit: Payer: Self-pay | Admitting: Sports Medicine

## 2019-11-23 ENCOUNTER — Other Ambulatory Visit: Payer: Self-pay | Admitting: Sports Medicine

## 2020-03-19 MED ORDER — ALPRAZOLAM 0.5 MG PO TABS: ORAL_TABLET | ORAL | 0 refills | Status: DC

## 2020-03-19 NOTE — Telephone Encounter (Signed)
Pended for requested pharmacy

## 2020-03-31 DIAGNOSIS — Z79899 Other long term (current) drug therapy: Secondary | ICD-10-CM | POA: Diagnosis not present

## 2020-03-31 DIAGNOSIS — K8020 Calculus of gallbladder without cholecystitis without obstruction: Secondary | ICD-10-CM | POA: Diagnosis not present

## 2020-03-31 DIAGNOSIS — R1084 Generalized abdominal pain: Secondary | ICD-10-CM | POA: Diagnosis not present

## 2020-03-31 DIAGNOSIS — R231 Pallor: Secondary | ICD-10-CM | POA: Diagnosis not present

## 2020-03-31 DIAGNOSIS — I10 Essential (primary) hypertension: Secondary | ICD-10-CM | POA: Diagnosis not present

## 2020-03-31 DIAGNOSIS — R112 Nausea with vomiting, unspecified: Secondary | ICD-10-CM | POA: Diagnosis not present

## 2020-03-31 DIAGNOSIS — K828 Other specified diseases of gallbladder: Secondary | ICD-10-CM | POA: Diagnosis not present

## 2020-03-31 DIAGNOSIS — R61 Generalized hyperhidrosis: Secondary | ICD-10-CM | POA: Diagnosis not present

## 2020-03-31 DIAGNOSIS — K8080 Other cholelithiasis without obstruction: Secondary | ICD-10-CM | POA: Diagnosis not present

## 2020-05-17 ENCOUNTER — Other Ambulatory Visit: Payer: Self-pay | Admitting: Sports Medicine

## 2020-05-21 ENCOUNTER — Other Ambulatory Visit: Payer: Self-pay | Admitting: Sports Medicine

## 2020-05-22 ENCOUNTER — Other Ambulatory Visit: Payer: Self-pay | Admitting: Sports Medicine

## 2020-05-22 NOTE — Telephone Encounter (Signed)
Last OV - 03/31/19 Last written - 03/19/20

## 2020-06-01 DIAGNOSIS — K8020 Calculus of gallbladder without cholecystitis without obstruction: Secondary | ICD-10-CM | POA: Diagnosis not present

## 2020-06-06 ENCOUNTER — Ambulatory Visit (INDEPENDENT_AMBULATORY_CARE_PROVIDER_SITE_OTHER): Payer: BC Managed Care – PPO | Admitting: Sports Medicine

## 2020-06-06 DIAGNOSIS — I10 Essential (primary) hypertension: Secondary | ICD-10-CM

## 2020-06-06 DIAGNOSIS — K8050 Calculus of bile duct without cholangitis or cholecystitis without obstruction: Secondary | ICD-10-CM

## 2020-06-06 DIAGNOSIS — N400 Enlarged prostate without lower urinary tract symptoms: Secondary | ICD-10-CM | POA: Diagnosis not present

## 2020-06-06 DIAGNOSIS — Z0000 Encounter for general adult medical examination without abnormal findings: Secondary | ICD-10-CM

## 2020-06-06 DIAGNOSIS — Z9049 Acquired absence of other specified parts of digestive tract: Secondary | ICD-10-CM | POA: Insufficient documentation

## 2020-06-06 MED ORDER — LISINOPRIL 10 MG PO TABS: 10.0000 mg | ORAL_TABLET | Freq: Every day | ORAL | 3 refills | Status: DC

## 2020-06-06 NOTE — Assessment & Plan Note (Signed)
Established with general surgery, laparoscopic cholecystectomy planned.

## 2020-06-06 NOTE — Assessment & Plan Note (Signed)
Colin Carter returns, he is a pleasant 50 year old male, he is here for his physical, he is doing well, he has a new job where he works from home, unfortunately this is interfered with his social life but he is okay with it. No depressive symptoms, ordering routine labs.

## 2020-06-06 NOTE — Progress Notes (Signed)
° ° °  Procedures performed today:    None.  Independent interpretation of notes and tests performed by another provider:   None.  Brief History, Exam, Impression, and Recommendations:    Annual physical exam Colin Carter returns, he is a pleasant 49 year old male, he is here for his physical, he is doing well, he has a new job where he works from home, unfortunately this is interfered with his social life but he is okay with it. No depressive symptoms, ordering routine labs.  Hypertension Blood pressure is a bit low, asymptomatic, dropping lisinopril to 10 mg daily.  Biliary colic Established with general surgery, laparoscopic cholecystectomy planned.    ___________________________________________ Colin Carter. Benjamin Stain, M.D., ABFM., CAQSM. Primary Care and Sports Medicine [facility]  Adjunct Instructor of Family Medicine  [employer]

## 2020-06-06 NOTE — Assessment & Plan Note (Signed)
Blood pressure is a bit low, asymptomatic, dropping lisinopril to 10 mg daily.

## 2020-06-07 LAB — COMPLETE METABOLIC PANEL WITH GFR
AG Ratio: 2.1 (calc) (ref 1.0–2.5)
ALT: 35 U/L (ref 9–46)
AST: 19 U/L (ref 10–40)
Albumin: 4.5 g/dL (ref 3.6–5.1)
Alkaline phosphatase (APISO): 74 U/L (ref 36–130)
BUN: 13 mg/dL (ref 7–25)
CO2: 26 mmol/L (ref 20–32)
Calcium: 9.5 mg/dL (ref 8.6–10.3)
Chloride: 103 mmol/L (ref 98–110)
Creat: 1.19 mg/dL (ref 0.60–1.35)
GFR, Est African American: 83 mL/min/{1.73_m2} (ref >=60)
GFR, Est Non African American: 72 mL/min/{1.73_m2} (ref >=60)
Globulin: 2.1 g/dL (calc) (ref 1.9–3.7)
Glucose, Bld: 104 mg/dL — ABNORMAL HIGH (ref 65–99)
Potassium: 4.3 mmol/L (ref 3.5–5.3)
Sodium: 139 mmol/L (ref 135–146)
Total Bilirubin: 0.7 mg/dL (ref 0.2–1.2)
Total Protein: 6.6 g/dL (ref 6.1–8.1)

## 2020-06-07 LAB — TSH: TSH: 1.35 mIU/L (ref 0.40–4.50)

## 2020-06-07 LAB — LIPID PANEL W/REFLEX DIRECT LDL
Cholesterol: 214 mg/dL — ABNORMAL HIGH (ref <200)
HDL: 42 mg/dL (ref >=40)
LDL Cholesterol (Calc): 137 mg/dL (calc) — ABNORMAL HIGH
Non-HDL Cholesterol (Calc): 172 mg/dL (calc) — ABNORMAL HIGH (ref <130)
Total CHOL/HDL Ratio: 5.1 (calc) — ABNORMAL HIGH (ref <5.0)
Triglycerides: 213 mg/dL — ABNORMAL HIGH (ref <150)

## 2020-06-07 LAB — CBC
HCT: 47.5 % (ref 38.5–50.0)
Hemoglobin: 16 g/dL (ref 13.2–17.1)
MCH: 29.5 pg (ref 27.0–33.0)
MCHC: 33.7 g/dL (ref 32.0–36.0)
MCV: 87.6 fL (ref 80.0–100.0)
MPV: 10.1 fL (ref 7.5–12.5)
Platelets: 240 10*3/uL (ref 140–400)
RBC: 5.42 10*6/uL (ref 4.20–5.80)
RDW: 11.9 % (ref 11.0–15.0)
WBC: 6.5 10*3/uL (ref 3.8–10.8)

## 2020-06-07 LAB — PSA, TOTAL AND FREE
PSA, % Free: 33 % (calc) (ref >25)
PSA, Free: 0.2 ng/mL
PSA, Total: 0.6 ng/mL (ref <=4.0)

## 2020-06-12 MED ORDER — ALPRAZOLAM 0.5 MG PO TABS: ORAL_TABLET | ORAL | 0 refills | Status: DC

## 2020-06-13 DIAGNOSIS — Z20822 Contact with and (suspected) exposure to covid-19: Secondary | ICD-10-CM | POA: Diagnosis not present

## 2020-06-13 DIAGNOSIS — Z01812 Encounter for preprocedural laboratory examination: Secondary | ICD-10-CM | POA: Diagnosis not present

## 2020-06-15 DIAGNOSIS — K8080 Other cholelithiasis without obstruction: Secondary | ICD-10-CM | POA: Diagnosis not present

## 2020-06-15 DIAGNOSIS — Z79899 Other long term (current) drug therapy: Secondary | ICD-10-CM | POA: Diagnosis not present

## 2020-06-15 DIAGNOSIS — K8020 Calculus of gallbladder without cholecystitis without obstruction: Secondary | ICD-10-CM | POA: Diagnosis not present

## 2020-06-15 DIAGNOSIS — K8010 Calculus of gallbladder with chronic cholecystitis without obstruction: Secondary | ICD-10-CM | POA: Diagnosis not present

## 2020-06-15 DIAGNOSIS — I10 Essential (primary) hypertension: Secondary | ICD-10-CM | POA: Diagnosis not present

## 2020-06-15 DIAGNOSIS — F419 Anxiety disorder, unspecified: Secondary | ICD-10-CM | POA: Diagnosis not present

## 2020-08-08 ENCOUNTER — Other Ambulatory Visit: Payer: Self-pay | Admitting: Sports Medicine

## 2020-09-07 ENCOUNTER — Other Ambulatory Visit: Payer: Self-pay | Admitting: Sports Medicine

## 2020-09-07 DIAGNOSIS — I10 Essential (primary) hypertension: Secondary | ICD-10-CM

## 2020-09-07 MED ORDER — LISINOPRIL 10 MG PO TABS: 10.0000 mg | ORAL_TABLET | Freq: Every day | ORAL | 3 refills | Status: DC

## 2020-09-19 ENCOUNTER — Emergency Department (INDEPENDENT_AMBULATORY_CARE_PROVIDER_SITE_OTHER)
Admission: EM | Admit: 2020-09-19 | Discharge: 2020-09-19 | Disposition: A | Discharge status: Home / Self Care | Payer: BC Managed Care – PPO | Source: Home / Self Care | Attending: Family Medicine | Admitting: Family Medicine

## 2020-09-19 ENCOUNTER — Other Ambulatory Visit: Payer: Self-pay

## 2020-09-19 ENCOUNTER — Encounter: Payer: Self-pay | Admitting: *Deleted

## 2020-09-19 DIAGNOSIS — R059 Cough, unspecified: Secondary | ICD-10-CM

## 2020-09-19 DIAGNOSIS — Z8616 Personal history of COVID-19: Secondary | ICD-10-CM

## 2020-09-19 HISTORY — DX: Essential (primary) hypertension: I10

## 2020-09-19 NOTE — Discharge Instructions (Addendum)
Isolate yourself until the below conditions are met: 1)  At least 10 days since symptoms onset. AND 2)  > 72 hours after symptom resolution (absence of fever without the use of fever-reducing medicine, and improvement in respiratory symptoms.

## 2020-09-19 NOTE — ED Provider Notes (Signed)
[facility]    CSN: [id number] Arrival date & time: 09/19/20  0817      History   Chief Complaint Chief Complaint  Patient presents with  . Headache  . Cough  . Fatigue    HPI Colin Carter is a 49 y.o. male.   One week ago patient developed fatigue, headache, and cough.  He has had nausea (without vomiting), diarrhea, nasal congestion, and mild myalgias.  He has lost his sense of taste/smell.  He denies pleuritic pain or shortness of breath. Four days ago he had a positive rapid COVID test.  He now needs a PCR test to show his employer.  The history is provided by the patient.    Past Medical History:  Diagnosis Date  . Hypertension     Patient Active Problem List   Diagnosis Date Noted  . Biliary colic 83/41/9622  . 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 Chronic fatigue 07/01/2017  . Hearing loss 07/01/2017  . Hypertriglyceridemia 06/05/2016  . Plantar fasciitis, right 06/05/2015  . Generalized anxiety disorder 09/19/2014  . Hypertension 06/23/2014  . Annual physical exam 03/10/2014  . Lumbar spondylosis 03/10/2014    Past Surgical History:  Procedure Laterality Date  . CHOLECYSTECTOMY         Home Medications    Prior to Admission medications   Medication Sig Start Date End Date Taking? Authorizing Provider  ALPRAZolam Duanne Moron) 0.5 MG tablet Take 1 tablet by mouth twice daily as needed for anxiety 08/08/20   Silverio Decamp, MD  lisinopril (ZESTRIL) 10 MG tablet Take 1 tablet (10 mg total) by mouth daily. 09/07/20   Silverio Decamp, MD    Family History Family History  Problem Relation Age of Onset  . Heart attack Father     Social History Social History   Tobacco Use  . Smoking status: Never Smoker  . Smokeless tobacco: Never Used  Vaping Use  . Vaping Use: Never used  Substance Use Topics  . Alcohol use: No  . Drug use: No     Allergies   Patient has no known allergies.   Review of Systems Review of Systems    No sore throat + cough No pleuritic pain No wheezing + nasal congestion + post-nasal drainage No sinus pain/pressure No itchy/red eyes No earache No hemoptysis No SOB No fever, + chills + nausea No vomiting No abdominal pain + diarrhea No urinary symptoms No skin rash + fatigue + myalgias + headache    Physical Exam Triage Vital Signs ED Triage Vitals  Enc Vitals Group     BP 09/19/20 0840 125/80     Pulse Rate 09/19/20 0840 60     Resp 09/19/20 0840 18     Temp 09/19/20 0840 98.7 F (37.1 C)     Temp Source 09/19/20 0840 Oral     SpO2 09/19/20 0840 97 %     Weight 09/19/20 0838 155 lb (70.3 kg)     Height 09/19/20 0838 5' 8"  (1.727 m)     Head Circumference --      Peak Flow --      Pain Score 09/19/20 0838 0     Pain Loc --      Pain Edu? --      Excl. in [place]? --    No data found.  Updated Vital Signs BP 125/80 (BP Location: Right Arm)   Pulse 60   Temp 98.7 F (37.1 C) (Oral)   Resp 18   Ht  5' 8"  (1.727 m)   Wt 70.3 kg   SpO2 97%   BMI 23.57 kg/m   Visual Acuity Right Eye Distance:   Left Eye Distance:   Bilateral Distance:    Right Eye Near:   Left Eye Near:    Bilateral Near:     Physical Exam Nursing notes and Vital Signs reviewed. Appearance:  Patient appears stated age, and in no acute distress Eyes:  Pupils are equal, round, and reactive to light and accomodation.  Extraocular movement is intact.  Conjunctivae are not inflamed  Ears:  Canals normal.  Tympanic membranes normal.  Nose:  Mildly congested turbinates.  No sinus tenderness.  Pharynx:  Normal Neck:  Supple.  No adenopathy.  Lungs:  Clear to auscultation.  Breath sounds are equal.  Moving air well. Heart:  Regular rate and rhythm without murmurs, rubs, or gallops.  Abdomen:  Nontender without masses or hepatosplenomegaly.  Bowel sounds are present.  No CVA or flank tenderness.  Extremities:  No edema.  Skin:  No rash present.   UC Treatments / Results  Labs (all  labs ordered are listed, but only abnormal results are displayed) Labs Reviewed  NOVEL CORONAVIRUS, NAA    EKG   Radiology No results found.  Procedures Procedures (including critical care time)  Medications Ordered in UC Medications - No data to display  Initial Impression / Assessment and Plan / UC Course  I have reviewed the triage vital signs and the nursing notes.  Pertinent labs & imaging results that were available during my care of the patient were reviewed by me and considered in my medical decision making (see chart for details).    Benign exam today.  There is no evidence of bacterial infection today.  Treat symptomatically for now. COVID19 PCR pending.  Final Clinical Impressions(s) / UC Diagnoses   Final diagnoses:  Cough  History of COVID-19     Discharge Instructions     Isolate yourself until the below conditions are met: 1)  At least 10 days since symptoms onset. AND 2)  > 72 hours after symptom resolution (absence of fever without the use of fever-reducing medicine, and improvement in respiratory symptoms.    ED Prescriptions    None        Kandra Nicolas, MD 09/24/20 1932

## 2020-09-19 NOTE — ED Triage Notes (Signed)
Pt c/o cough, fatigue and HA x 1 wk. Took an at home Covid test positive.He needs a test for his employer to show he had Covid. Denies Covid vaccine.

## 2020-09-21 LAB — NOVEL CORONAVIRUS, NAA: SARS-CoV-2, NAA: DETECTED — AB

## 2020-09-21 LAB — SARS-COV-2, NAA 2 DAY TAT

## 2020-09-23 ENCOUNTER — Telehealth: Payer: Self-pay | Admitting: Physician Assistant

## 2020-09-23 ENCOUNTER — Telehealth: Payer: Self-pay | Admitting: Emergency Medicine

## 2020-09-23 NOTE — Telephone Encounter (Signed)
Called to discuss with patient about Covid symptoms and the use of sotrovimab, bamlanivimab/etesevimab or casirivimab/imdevimab, a monoclonal antibody infusion for those with mild to moderate Covid symptoms and at a high risk of hospitalization.  Pt is qualified for this infusion at the [facility] due to; Specific high risk criteria : Cardiovascular disease or hypertension   Sent MyChart info  Cline Crock PA-C

## 2020-09-23 NOTE — Telephone Encounter (Signed)
Return call to pt regarding ongoing fatigue - no answer - message left w/ call back # & # for COVID clinic in [place] [phone]

## 2020-09-25 ENCOUNTER — Telehealth (INDEPENDENT_AMBULATORY_CARE_PROVIDER_SITE_OTHER): Payer: BC Managed Care – PPO | Admitting: Family Medicine

## 2020-09-25 ENCOUNTER — Encounter: Payer: Self-pay | Admitting: Family Medicine

## 2020-09-25 DIAGNOSIS — U071 COVID-19: Secondary | ICD-10-CM | POA: Diagnosis not present

## 2020-09-25 NOTE — Assessment & Plan Note (Signed)
Recovering well from COVID-19 at this point.  Recently developed rash on LE.  Discussed that rash can sometimes accompany COVID.   Discussed red flag symptoms including  blistering rash, painful rash, swelling of leg, spreading rash, redevelopment of fever.  He may use cortisone cream and/or antihistamine if he develops itching.

## 2020-09-25 NOTE — Progress Notes (Signed)
Colin Carter - 49 y.o. male MRN [id number]  Date of birth: 08-Mar-1971   This visit type was conducted due to national recommendations for restrictions regarding the COVID-19 Pandemic (e.g. social distancing).  This format is felt to be most appropriate for this patient at this time.  All issues noted in this document were discussed and addressed.  No physical exam was performed (except for noted visual exam findings with Video Visits).  I discussed the limitations of evaluation and management by telemedicine and the availability of in person appointments. The patient expressed understanding and agreed to proceed.  I connected with@ on 09/25/20 at  9:10 AM EST by a video enabled telemedicine application and verified that I am speaking with the correct person using two identifiers.  Present at visit: Everrett Coombe, DO Lonia Mad   Patient Location: Home [address] [place] [address]   Provider location:   [facility]  Chief Complaint  Patient presents with  . Covid Positive    HPI  Colin Carter is a 49 y.o. male who presents via audio/video conferencing for a telehealth visit today.  He his being seen today after positive COVID test.  He began having symptoms on 10/28 and had + test on 11/3.  He felt really fatigued with some night sweats and body aches.  He had mild cough but denies shortness of breath.  He woke up with hive like rash on bilateral legs this morning.  The rash is not itchy or painful.  There is no blistering.  There is no swelling of the leg or calf pain.      ROS:  A comprehensive ROS was completed and negative except as noted per HPI  Past Medical History:  Diagnosis Date  . Hypertension     Past Surgical History:  Procedure Laterality Date  . CHOLECYSTECTOMY      Family History  Problem Relation Age of Onset  . Heart attack Father     Social History   Socioeconomic History  . Marital status: Married    Spouse name: Not on file  .  Number of children: Not on file  . Years of education: Not on file  . Highest education level: Not on file  Occupational History  . Not on file  Tobacco Use  . Smoking status: Never Smoker  . Smokeless tobacco: Never Used  Vaping Use  . Vaping Use: Never used  Substance and Sexual Activity  . Alcohol use: No  . Drug use: No  . Sexual activity: Not on file  Other Topics Concern  . Not on file  Social History Narrative  . Not on file   Social Determinants of Health   Financial Resource Strain:   . Difficulty of Paying Living Expenses: Not on file  Food Insecurity:   . Worried About Programme researcher, broadcasting/film/video in the Last Year: Not on file  . Ran Out of Food in the Last Year: Not on file  Transportation Needs:   . Lack of Transportation (Medical): Not on file  . Lack of Transportation (Non-Medical): Not on file  Physical Activity:   . Days of Exercise per Week: Not on file  . Minutes of Exercise per Session: Not on file  Stress:   . Feeling of Stress : Not on file  Social Connections:   . Frequency of Communication with Friends and Family: Not on file  . Frequency of Social Gatherings with Friends and Family: Not on file  . Attends Religious  Services: Not on file  . Active Member of Clubs or Organizations: Not on file  . Attends Banker Meetings: Not on file  . Marital Status: Not on file  Intimate Partner Violence:   . Fear of Current or Ex-Partner: Not on file  . Emotionally Abused: Not on file  . Physically Abused: Not on file  . Sexually Abused: Not on file     Current Outpatient Medications:  .  ALPRAZolam (XANAX) 0.5 MG tablet, Take 1 tablet by mouth twice daily as needed for anxiety, Disp: 60 tablet, Rfl: 0 .  lisinopril (ZESTRIL) 10 MG tablet, Take 1 tablet (10 mg total) by mouth daily., Disp: 90 tablet, Rfl: 3  EXAM:  VITALS per patient if applicable: Temp 97.6 F (36.4 C)   Wt 155 lb (70.3 kg)   BMI 23.57 kg/m   GENERAL: alert, oriented,  appears well and in no acute distress  HEENT: atraumatic, conjunttiva clear, no obvious abnormalities on inspection of external nose and ears  NECK: normal movements of the head and neck  LUNGS: on inspection no signs of respiratory distress, breathing rate appears normal, no obvious gross SOB, gasping or wheezing  CV: no obvious cyanosis  MS: moves all visible extremities without noticeable abnormality  PSYCH/NEURO: pleasant and cooperative, no obvious depression or anxiety, speech and thought processing grossly intact  ASSESSMENT AND PLAN:  Discussed the following assessment and plan:  COVID-19 Recovering well from COVID-19 at this point.  Recently developed rash on LE.  Discussed that rash can sometimes accompany COVID.   Discussed red flag symptoms including  blistering rash, painful rash, swelling of leg, spreading rash, redevelopment of fever.  He may use cortisone cream and/or antihistamine if he develops itching.       I discussed the assessment and treatment plan with the patient. The patient was provided an opportunity to ask questions and all were answered. The patient agreed with the plan and demonstrated an understanding of the instructions.   The patient was advised to call back or seek an in-person evaluation if the symptoms worsen or if the condition fails to improve as anticipated.    Everrett Coombe, DO

## 2020-09-25 NOTE — Progress Notes (Signed)
COVID symptoms: 10/28 Tested on 11/3 Results on 11/5  Symptoms: cough with deep breathing, fatigue Woke up this morning with rash  Taking Vitamin D & Zinc.

## 2020-10-03 ENCOUNTER — Other Ambulatory Visit: Payer: Self-pay | Admitting: Sports Medicine

## 2020-11-12 ENCOUNTER — Other Ambulatory Visit: Payer: Self-pay | Admitting: Sports Medicine

## 2020-12-25 ENCOUNTER — Other Ambulatory Visit: Payer: Self-pay | Admitting: Sports Medicine

## 2021-01-15 ENCOUNTER — Other Ambulatory Visit: Payer: Self-pay | Admitting: Family Medicine

## 2021-01-15 DIAGNOSIS — I10 Essential (primary) hypertension: Secondary | ICD-10-CM

## 2021-01-15 MED ORDER — LISINOPRIL 20 MG PO TABS: 20.0000 mg | ORAL_TABLET | Freq: Every day | ORAL | 1 refills | Status: DC

## 2021-01-30 ENCOUNTER — Other Ambulatory Visit: Payer: Self-pay | Admitting: Sports Medicine

## 2021-03-04 ENCOUNTER — Other Ambulatory Visit: Payer: Self-pay | Admitting: Sports Medicine

## 2021-04-03 ENCOUNTER — Other Ambulatory Visit: Payer: Self-pay | Admitting: Sports Medicine

## 2021-04-24 ENCOUNTER — Other Ambulatory Visit: Payer: Self-pay | Admitting: Sports Medicine

## 2021-05-29 ENCOUNTER — Other Ambulatory Visit: Payer: Self-pay | Admitting: Sports Medicine

## 2021-05-30 NOTE — Telephone Encounter (Signed)
All pt: partial fill send on med.  Needs to schedule f/u with Dr. Karie Schwalbe for further refills. Hasn't been seen in 1 yr.

## 2021-06-03 NOTE — Telephone Encounter (Signed)
Patient has a physical scheduled with Dr T for 06/14/21. AM

## 2021-06-12 ENCOUNTER — Encounter: Payer: BC Managed Care – PPO | Admitting: Sports Medicine

## 2021-06-14 ENCOUNTER — Other Ambulatory Visit: Payer: Self-pay

## 2021-06-14 ENCOUNTER — Ambulatory Visit (INDEPENDENT_AMBULATORY_CARE_PROVIDER_SITE_OTHER): Payer: BC Managed Care – PPO | Admitting: Sports Medicine

## 2021-06-14 ENCOUNTER — Encounter: Payer: Self-pay | Admitting: Sports Medicine

## 2021-06-14 VITALS — BP 115/75 | HR 59 | Ht 68.0 in | Wt 154.0 lb

## 2021-06-14 DIAGNOSIS — Z0000 Encounter for general adult medical examination without abnormal findings: Secondary | ICD-10-CM

## 2021-06-14 DIAGNOSIS — F411 Generalized anxiety disorder: Secondary | ICD-10-CM | POA: Diagnosis not present

## 2021-06-14 DIAGNOSIS — E781 Pure hyperglyceridemia: Secondary | ICD-10-CM

## 2021-06-14 NOTE — Progress Notes (Signed)
  Subjective:    CC: Annual Physical Exam  HPI:  This patient is here for their annual physical  I reviewed the past medical history, family history, social history, surgical history, and allergies today and no changes were needed.  Please see the problem list section below in epic for further details.  Past Medical History: Past Medical History:  Diagnosis Date   Hypertension    Past Surgical History: Past Surgical History:  Procedure Laterality Date   CHOLECYSTECTOMY     Social History: Social History   Socioeconomic History   Marital status: Married    Spouse name: Not on file   Number of children: Not on file   Years of education: Not on file   Highest education level: Not on file  Occupational History   Not on file  Tobacco Use   Smoking status: Never   Smokeless tobacco: Never  Vaping Use   Vaping Use: Never used  Substance and Sexual Activity   Alcohol use: No   Drug use: No   Sexual activity: Not on file  Other Topics Concern   Not on file  Social History Narrative   Not on file   Social Determinants of Health   Financial Resource Strain: Not on file  Food Insecurity: Not on file  Transportation Needs: Not on file  Physical Activity: Not on file  Stress: Not on file  Social Connections: Not on file   Family History: Family History  Problem Relation Age of Onset   Heart attack Father    Allergies: No Known Allergies Medications: See med rec.  Review of Systems: No headache, visual changes, nausea, vomiting, diarrhea, constipation, dizziness, abdominal pain, skin rash, fevers, chills, night sweats, swollen lymph nodes, weight loss, chest pain, body aches, joint swelling, muscle aches, shortness of breath, mood changes, visual or auditory hallucinations.  Objective:    General: Well Developed, well nourished, and in no acute distress.  Neuro: Alert and oriented x3, extra-ocular muscles intact, sensation grossly intact. Cranial nerves II through  XII are intact, motor, sensory, and coordinative functions are all intact. HEENT: Normocephalic, atraumatic, pupils equal round reactive to light, neck supple, no masses, no lymphadenopathy, thyroid nonpalpable. Oropharynx, nasopharynx, external ear canals are unremarkable. Skin: Warm and dry, no rashes noted.  Skin tag right axilla. Cardiac: Regular rate and rhythm, no murmurs rubs or gallops.  Respiratory: Clear to auscultation bilaterally. Not using accessory muscles, speaking in full sentences.  Abdominal: Soft, nontender, nondistended, positive bowel sounds, no masses, no organomegaly.  Musculoskeletal: Shoulder, elbow, wrist, hip, knee, ankle stable, and with full range of motion.  Impression and Recommendations:    The patient was counselled, risk factors were discussed, anticipatory guidance given.  Annual physical exam Annual physical exam as above. Declines colon cancer screening, risks, benefits explained. Checking routine labs.   Generalized anxiety disorder Colin Carter had a flare of generalized anxiety, he needed a few days off work, I did fill out some disability paperwork for him covering him for these dates.   ___________________________________________ Colin Carter. Colin Carter, M.D., ABFM., CAQSM. Primary Care and Sports Medicine [facility]  Adjunct Professor of Family Medicine  [employer]

## 2021-06-14 NOTE — Assessment & Plan Note (Signed)
Colin Carter had a flare of generalized anxiety, he needed a few days off work, I did fill out some disability paperwork for him covering him for these dates.

## 2021-06-14 NOTE — Assessment & Plan Note (Signed)
Annual physical exam as above. Declines colon cancer screening, risks, benefits explained. Checking routine labs.

## 2021-06-15 LAB — COMPREHENSIVE METABOLIC PANEL
AG Ratio: 2 (calc) (ref 1.0–2.5)
ALT: 38 U/L (ref 9–46)
AST: 22 U/L (ref 10–40)
Albumin: 4.5 g/dL (ref 3.6–5.1)
Alkaline phosphatase (APISO): 85 U/L (ref 36–130)
BUN: 16 mg/dL (ref 7–25)
CO2: 29 mmol/L (ref 20–32)
Calcium: 9.5 mg/dL (ref 8.6–10.3)
Chloride: 100 mmol/L (ref 98–110)
Creat: 1.18 mg/dL (ref 0.60–1.29)
Globulin: 2.2 g/dL (calc) (ref 1.9–3.7)
Glucose, Bld: 82 mg/dL (ref 65–99)
Potassium: 4.3 mmol/L (ref 3.5–5.3)
Sodium: 138 mmol/L (ref 135–146)
Total Bilirubin: 0.7 mg/dL (ref 0.2–1.2)
Total Protein: 6.7 g/dL (ref 6.1–8.1)

## 2021-06-15 LAB — LIPID PANEL
Cholesterol: 201 mg/dL — ABNORMAL HIGH (ref <200)
HDL: 46 mg/dL (ref >=40)
LDL Cholesterol (Calc): 130 mg/dL (calc) — ABNORMAL HIGH
Non-HDL Cholesterol (Calc): 155 mg/dL (calc) — ABNORMAL HIGH (ref <130)
Total CHOL/HDL Ratio: 4.4 (calc) (ref <5.0)
Triglycerides: 133 mg/dL (ref <150)

## 2021-06-15 LAB — TSH: TSH: 0.68 mIU/L (ref 0.40–4.50)

## 2021-06-15 LAB — CBC
HCT: 48.7 % (ref 38.5–50.0)
Hemoglobin: 16.5 g/dL (ref 13.2–17.1)
MCH: 29.2 pg (ref 27.0–33.0)
MCHC: 33.9 g/dL (ref 32.0–36.0)
MCV: 86.2 fL (ref 80.0–100.0)
MPV: 10.1 fL (ref 7.5–12.5)
Platelets: 243 10*3/uL (ref 140–400)
RBC: 5.65 10*6/uL (ref 4.20–5.80)
RDW: 12 % (ref 11.0–15.0)
WBC: 6.9 10*3/uL (ref 3.8–10.8)

## 2021-06-26 ENCOUNTER — Other Ambulatory Visit: Payer: Self-pay | Admitting: Family Medicine

## 2021-06-26 DIAGNOSIS — I10 Essential (primary) hypertension: Secondary | ICD-10-CM

## 2021-07-02 ENCOUNTER — Other Ambulatory Visit: Payer: Self-pay | Admitting: Family Medicine

## 2021-07-07 ENCOUNTER — Other Ambulatory Visit: Payer: Self-pay | Admitting: Sports Medicine

## 2021-08-12 DIAGNOSIS — I10 Essential (primary) hypertension: Secondary | ICD-10-CM

## 2021-08-12 MED ORDER — ALPRAZOLAM 0.5 MG PO TABS: 0.5000 mg | ORAL_TABLET | Freq: Two times a day (BID) | ORAL | 0 refills | Status: DC | PRN

## 2021-08-12 MED ORDER — LISINOPRIL 20 MG PO TABS: 20.0000 mg | ORAL_TABLET | Freq: Every day | ORAL | 3 refills | Status: DC

## 2021-09-18 ENCOUNTER — Other Ambulatory Visit: Payer: Self-pay | Admitting: Sports Medicine

## 2021-09-25 ENCOUNTER — Other Ambulatory Visit: Payer: Self-pay

## 2021-09-25 ENCOUNTER — Ambulatory Visit (INDEPENDENT_AMBULATORY_CARE_PROVIDER_SITE_OTHER): Payer: Self-pay | Admitting: Sports Medicine

## 2021-09-25 ENCOUNTER — Ambulatory Visit (INDEPENDENT_AMBULATORY_CARE_PROVIDER_SITE_OTHER): Payer: Self-pay

## 2021-09-25 DIAGNOSIS — S93402A Sprain of unspecified ligament of left ankle, initial encounter: Secondary | ICD-10-CM | POA: Insufficient documentation

## 2021-09-25 DIAGNOSIS — S93492A Sprain of other ligament of left ankle, initial encounter: Secondary | ICD-10-CM

## 2021-09-25 NOTE — Telephone Encounter (Signed)
Patient aware and will come to appointment this afternoon.

## 2021-09-25 NOTE — Assessment & Plan Note (Signed)
This is a pleasant 50 year old male, he took a misstep out of a shed and rolled his ankle on a paver block, we were able to review the injury on his security camera footage. The foot was inverted, he was able to bear weight after rolling around on the ground for a while. He is tender over the ATFL and the CFL, but not over the bone today, he does have a positive anterior drawer sign.  No deltoid ligament pain, no distal tibiofibular ligament tenderness. Declines medication, he can use over-the-counter ibuprofen 800 mg 3 times daily, he does have a boot at home, he will wear this for at least 3 weeks, and return to see me in 3 weeks. X-rays today, conditioning today. After 3 to 4 weeks of boot immobilization we will probably get him into a lace up ASO, he does have 1 of these at home as well.

## 2021-09-25 NOTE — Telephone Encounter (Signed)
Left message for patient to call back. I have scheduled him for 3:20 today.

## 2021-09-25 NOTE — Progress Notes (Signed)
    Procedures performed today:    None.  Independent interpretation of notes and tests performed by another provider:   None.  Brief History, Exam, Impression, and Recommendations:    Left ankle sprain This is a pleasant 50 year old male, he took a misstep out of a shed and rolled his ankle on a paver block, we were able to review the injury on his security camera footage. The foot was inverted, he was able to bear weight after rolling around on the ground for a while. He is tender over the ATFL and the CFL, but not over the bone today, he does have a positive anterior drawer sign.  No deltoid ligament pain, no distal tibiofibular ligament tenderness. Declines medication, he can use over-the-counter ibuprofen 800 mg 3 times daily, he does have a boot at home, he will wear this for at least 3 weeks, and return to see me in 3 weeks. X-rays today, conditioning today. After 3 to 4 weeks of boot immobilization we will probably get him into a lace up ASO, he does have 1 of these at home as well.    ___________________________________________ Ihor Austin. Benjamin Stain, M.D., ABFM., CAQSM. Primary Care and Sports Medicine [facility]  Adjunct Instructor of Family Medicine  [employer]

## 2021-10-22 ENCOUNTER — Other Ambulatory Visit: Payer: Self-pay | Admitting: Sports Medicine

## 2021-10-22 NOTE — Telephone Encounter (Signed)
Patient aware prescription was sent to pharmacy.  

## 2021-11-20 ENCOUNTER — Other Ambulatory Visit: Payer: Self-pay | Admitting: Sports Medicine

## 2021-11-21 NOTE — Telephone Encounter (Signed)
Annual exam: 06/14/21 Last fill: 10/22/21 #60

## 2021-12-20 ENCOUNTER — Other Ambulatory Visit: Payer: Self-pay | Admitting: Sports Medicine

## 2022-01-27 ENCOUNTER — Other Ambulatory Visit: Payer: Self-pay | Admitting: Sports Medicine

## 2022-01-27 NOTE — Telephone Encounter (Signed)
#  60 on 12/20/21 ?

## 2022-01-27 NOTE — Telephone Encounter (Signed)
Left VM that refill was sent to pharmacy.  ?

## 2022-03-03 ENCOUNTER — Other Ambulatory Visit: Payer: Self-pay | Admitting: Sports Medicine

## 2022-03-03 NOTE — Telephone Encounter (Signed)
Last ov 06/14/21 ?

## 2022-03-14 ENCOUNTER — Encounter: Payer: Self-pay | Admitting: Sports Medicine

## 2022-03-14 ENCOUNTER — Ambulatory Visit (INDEPENDENT_AMBULATORY_CARE_PROVIDER_SITE_OTHER): Payer: Self-pay | Admitting: Sports Medicine

## 2022-03-14 VITALS — BP 100/63 | HR 91 | Ht 68.0 in | Wt 159.0 lb

## 2022-03-14 DIAGNOSIS — E291 Testicular hypofunction: Secondary | ICD-10-CM

## 2022-03-14 DIAGNOSIS — M47816 Spondylosis without myelopathy or radiculopathy, lumbar region: Secondary | ICD-10-CM

## 2022-03-14 DIAGNOSIS — R300 Dysuria: Secondary | ICD-10-CM

## 2022-03-14 DIAGNOSIS — F411 Generalized anxiety disorder: Secondary | ICD-10-CM

## 2022-03-14 DIAGNOSIS — E781 Pure hyperglyceridemia: Secondary | ICD-10-CM

## 2022-03-14 DIAGNOSIS — Z0000 Encounter for general adult medical examination without abnormal findings: Secondary | ICD-10-CM

## 2022-03-14 MED ORDER — MELATONIN 5 MG PO TABS: 5.0000 mg | ORAL_TABLET | Freq: Every day | ORAL | Status: AC

## 2022-03-14 MED ORDER — PREDNISONE 50 MG PO TABS: ORAL_TABLET | ORAL | 0 refills | Status: AC

## 2022-03-14 MED ORDER — SERTRALINE HCL 25 MG PO TABS
25.0000 mg | ORAL_TABLET | Freq: Every day | ORAL | 2 refills | Status: AC
Start: 2022-03-14 — End: ?

## 2022-03-14 NOTE — Assessment & Plan Note (Signed)
Colin Carter had a multitude of complaints, he does have significant OCD, generalized anxiety, when he gets off of work he has to sit with earmuffs on as sounds tend to irritate him. 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 ?He has had controller agents in the past including lithium, tricyclics, Prozac. ?Due to severity of persistent generalized anxiety symptoms, irritability, OCD symptoms we are going to start Zoloft 25 mg daily with a 6-week telephone follow-up. 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 ?He was on second shift for a while and complains of chronic fatigue, he wakes up around 10 and goes to bed about 2 AM, I have advised that he try to get back on a regular sleep-wake schedule, he will do melatonin at about 7 to 8 PM when the son sets to help get his circadian rhythms back on track. ?

## 2022-03-14 NOTE — Assessment & Plan Note (Signed)
Moderate lumbar spondylosis, MRI did show L5-S1 DDD, adding 5 days of prednisone, he does understand an epidural is an option when he is ready. ?

## 2022-03-14 NOTE — Assessment & Plan Note (Signed)
Patient is complaining more of an abnormal odor to his urine, we will check a urinalysis with his routine labs, I have advised him to hydrate aggressively to dilute the urine smell. ?

## 2022-03-14 NOTE — Progress Notes (Signed)
?Subjective:   ? ?CC: Annual Physical Exam ? ?HPI:  ?This patient is here for their annual physical ? ?I reviewed the past medical history, family history, social history, surgical history, and allergies today and no changes were needed.  Please see the problem list section below in epic for further details. ? ?Past Medical History: ?Past Medical History:  ?Diagnosis Date  ? Hypertension   ? ?Past Surgical History: ?Past Surgical History:  ?Procedure Laterality Date  ? CHOLECYSTECTOMY    ? ?Social History: ?Social History  ? ?Socioeconomic History  ? Marital status: Married  ?  Spouse name: Not on file  ? Number of children: Not on file  ? Years of education: Not on file  ? Highest education level: Not on file  ?Occupational History  ? Not on file  ?Tobacco Use  ? Smoking status: Never  ? Smokeless tobacco: Never  ?Vaping Use  ? Vaping Use: Never used  ?Substance and Sexual Activity  ? Alcohol use: No  ? Drug use: No  ? Sexual activity: Not on file  ?Other Topics Concern  ? Not on file  ?Social History Narrative  ? Not on file  ? ?Social Determinants of Health  ? ?Financial Resource Strain: Not on file  ?Food Insecurity: Not on file  ?Transportation Needs: Not on file  ?Physical Activity: Not on file  ?Stress: Not on file  ?Social Connections: Not on file  ? ?Family History: ?Family History  ?Problem Relation Age of Onset  ? Heart attack Father   ? ?Allergies: ?No Known Allergies ?Medications: See med rec. ? ?Review of Systems: No headache, visual changes, nausea, vomiting, diarrhea, constipation, dizziness, abdominal pain, skin rash, fevers, chills, night sweats, swollen lymph nodes, weight loss, chest pain, body aches, joint swelling, muscle aches, shortness of breath, mood changes, visual or auditory hallucinations. ? ?Objective:   ? ?General: Well Developed, well nourished, and in no acute distress.  ?Neuro: Alert and oriented x3, extra-ocular muscles intact, sensation grossly intact. Cranial nerves II through  XII are intact, motor, sensory, and coordinative functions are all intact. ?HEENT: Normocephalic, atraumatic, pupils equal round reactive to light, neck supple, no masses, no lymphadenopathy, thyroid nonpalpable. Oropharynx, nasopharynx, external ear canals are unremarkable. ?Skin: Warm and dry, no rashes noted.  ?Cardiac: Regular rate and rhythm, no murmurs rubs or gallops.  ?Respiratory: Clear to auscultation bilaterally. Not using accessory muscles, speaking in full sentences.  ?Abdominal: Soft, nontender, nondistended, positive bowel sounds, no masses, no organomegaly.  ?Musculoskeletal: Shoulder, elbow, wrist, hip, knee, ankle stable, and with full range of motion. ? ?Impression and Recommendations:   ? ?The patient was counselled, risk factors were discussed, anticipatory guidance given. ? ?Annual physical exam ?Annual physical as above, checking routine labs. ? ?Generalized anxiety disorder ?Armstead had a multitude of complaints, he does have significant OCD, generalized anxiety, when he gets off of work he has to sit with earmuffs on as sounds tend to irritate him. 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 ?He has had controller agents in the past including lithium, tricyclics, Prozac. ?Due to severity of persistent generalized anxiety symptoms, irritability, OCD symptoms we are going to start Zoloft 25 mg daily with a 6-week telephone follow-up. 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 ?He was on second shift for a while and complains of chronic fatigue, he wakes up around 10 and goes to bed about 2 AM, I have advised that he  try to get back on a regular sleep-wake schedule, he will do melatonin at about 7 to 8 PM when the son sets to help get his circadian rhythms back on track. ? ?Lumbar spondylosis ?Moderate lumbar spondylosis, MRI did show L5-S1 DDD, adding 5 days of prednisone, he does understand an  epidural is an option when he is ready. ? ?Dysuria ?Patient is complaining more of an abnormal odor to his urine, we will check a urinalysis with his routine labs, I have advised him to hydrate aggressively to dilute the urine smell. ? ?Chronic processes with exacerbation and pharmacologic intervention ? ?___________________________________________ ?Ihor Austin. Benjamin Stain, M.D., ABFM., CAQSM. ?Primary Care and Sports Medicine ?[facility] ? ?Adjunct Professor of Family Medicine  ?[employer] ?

## 2022-03-14 NOTE — Assessment & Plan Note (Signed)
Annual physical as above, checking routine labs. 

## 2022-03-19 LAB — CBC
HCT: 49 % (ref 38.5–50.0)
Hemoglobin: 16.4 g/dL (ref 13.2–17.1)
MCH: 29.1 pg (ref 27.0–33.0)
MCHC: 33.5 g/dL (ref 32.0–36.0)
MCV: 86.9 fL (ref 80.0–100.0)
MPV: 10.4 fL (ref 7.5–12.5)
Platelets: 240 10*3/uL (ref 140–400)
RBC: 5.64 10*6/uL (ref 4.20–5.80)
RDW: 12.2 % (ref 11.0–15.0)
WBC: 7.5 10*3/uL (ref 3.8–10.8)

## 2022-03-19 LAB — COMPREHENSIVE METABOLIC PANEL
AG Ratio: 1.7 (calc) (ref 1.0–2.5)
ALT: 47 U/L — ABNORMAL HIGH (ref 9–46)
AST: 25 U/L (ref 10–35)
Albumin: 4.3 g/dL (ref 3.6–5.1)
Alkaline phosphatase (APISO): 88 U/L (ref 35–144)
BUN: 14 mg/dL (ref 7–25)
CO2: 28 mmol/L (ref 20–32)
Calcium: 9.4 mg/dL (ref 8.6–10.3)
Chloride: 101 mmol/L (ref 98–110)
Creat: 1.22 mg/dL (ref 0.70–1.30)
Globulin: 2.5 g/dL (calc) (ref 1.9–3.7)
Glucose, Bld: 97 mg/dL (ref 65–99)
Potassium: 4.5 mmol/L (ref 3.5–5.3)
Sodium: 140 mmol/L (ref 135–146)
Total Bilirubin: 0.7 mg/dL (ref 0.2–1.2)
Total Protein: 6.8 g/dL (ref 6.1–8.1)

## 2022-03-19 LAB — LIPID PANEL
Cholesterol: 202 mg/dL — ABNORMAL HIGH (ref <200)
HDL: 53 mg/dL (ref >=40)
LDL Cholesterol (Calc): 121 mg/dL (calc) — ABNORMAL HIGH
Non-HDL Cholesterol (Calc): 149 mg/dL (calc) — ABNORMAL HIGH (ref <130)
Total CHOL/HDL Ratio: 3.8 (calc) (ref <5.0)
Triglycerides: 167 mg/dL — ABNORMAL HIGH (ref <150)

## 2022-03-19 LAB — URINALYSIS W MICROSCOPIC + REFLEX CULTURE
Bacteria, UA: NONE SEEN /HPF
Bilirubin Urine: NEGATIVE
Glucose, UA: NEGATIVE
Hgb urine dipstick: NEGATIVE
Hyaline Cast: NONE SEEN /LPF
Ketones, ur: NEGATIVE
Leukocyte Esterase: NEGATIVE
Nitrites, Initial: NEGATIVE
Protein, ur: NEGATIVE
RBC / HPF: NONE SEEN /HPF (ref 0–2)
Specific Gravity, Urine: 1.017 (ref 1.001–1.035)
Squamous Epithelial / HPF: NONE SEEN /HPF (ref <=5)
WBC, UA: NONE SEEN /HPF (ref 0–5)
pH: 6 (ref 5.0–8.0)

## 2022-03-19 LAB — TSH: TSH: 1.2 mIU/L (ref 0.40–4.50)

## 2022-03-19 LAB — TESTOSTERONE, FREE & TOTAL
Free Testosterone: 90.3 pg/mL (ref 35.0–155.0)
Testosterone, Total, LC-MS-MS: 385 ng/dL (ref 250–1100)

## 2022-03-19 LAB — NO CULTURE INDICATED

## 2022-04-06 ENCOUNTER — Other Ambulatory Visit: Payer: Self-pay | Admitting: Sports Medicine

## 2022-05-05 ENCOUNTER — Ambulatory Visit: Payer: Self-pay | Admitting: Sports Medicine

## 2022-05-06 ENCOUNTER — Ambulatory Visit: Payer: Self-pay | Admitting: Sports Medicine

## 2022-05-07 ENCOUNTER — Other Ambulatory Visit: Payer: Self-pay | Admitting: Sports Medicine

## 2022-05-13 ENCOUNTER — Encounter: Payer: Self-pay | Admitting: Sports Medicine

## 2022-06-02 ENCOUNTER — Other Ambulatory Visit: Payer: Self-pay | Admitting: Sports Medicine

## 2022-07-14 ENCOUNTER — Other Ambulatory Visit: Payer: Self-pay | Admitting: Sports Medicine

## 2022-07-14 DIAGNOSIS — I10 Essential (primary) hypertension: Secondary | ICD-10-CM

## 2022-08-15 ENCOUNTER — Other Ambulatory Visit: Payer: Self-pay | Admitting: Sports Medicine

## 2022-09-01 ENCOUNTER — Ambulatory Visit (INDEPENDENT_AMBULATORY_CARE_PROVIDER_SITE_OTHER): Payer: Self-pay | Admitting: Sports Medicine

## 2022-09-01 ENCOUNTER — Ambulatory Visit (INDEPENDENT_AMBULATORY_CARE_PROVIDER_SITE_OTHER): Payer: Self-pay

## 2022-09-01 ENCOUNTER — Encounter: Payer: Self-pay | Admitting: Sports Medicine

## 2022-09-01 VITALS — BP 138/90 | HR 65 | Ht 68.0 in | Wt 170.0 lb

## 2022-09-01 DIAGNOSIS — R194 Change in bowel habit: Secondary | ICD-10-CM

## 2022-09-01 DIAGNOSIS — A09 Infectious gastroenteritis and colitis, unspecified: Secondary | ICD-10-CM

## 2022-09-01 NOTE — Assessment & Plan Note (Signed)
Colin Carter is a 49 old male, for the past 6 to 7 weeks has had a change in his stool habits without any change in diet. He describes a sensation of tenesmus, he needs to go sit on the toilet maybe 2-3 times a day, passes lots of gas, and occasional stool granules. Does not really have any pain, no fevers, chills, no melena, hematochezia, hematemesis. Abdominal exam is normal. We will do some stool studies, I would like an abdominal x-ray, we will hold off on treatment for now.

## 2022-09-01 NOTE — Progress Notes (Signed)
    Procedures performed today:    None.  Independent interpretation of notes and tests performed by another provider:   None.  Brief History, Exam, Impression, and Recommendations:    Change in stool habits Colin Carter is a 31 old male, for the past 6 to 7 weeks has had a change in his stool habits without any change in diet. He describes a sensation of tenesmus, he needs to go sit on the toilet maybe 2-3 times a day, passes lots of gas, and occasional stool granules. Does not really have any pain, no fevers, chills, no melena, hematochezia, hematemesis. Abdominal exam is normal. We will do some stool studies, I would like an abdominal x-ray, we will hold off on treatment for now.    ____________________________________________ Gwen Her. Dianah Field, M.D., ABFM., CAQSM., AME. Primary Care and Sports Medicine [facility]  Adjunct Professor of [place] [employer] [facility] [employer]  Risk manager

## 2022-09-12 LAB — SALMONELLA/SHIGELLA CULT, CAMPY EIA AND SHIGA TOXIN RFL ECOLI
MICRO NUMBER: 14070890
MICRO NUMBER:: 14070891
MICRO NUMBER:: 14070892
Result:: NOT DETECTED
SHIGA RESULT:: NOT DETECTED
SPECIMEN QUALITY: ADEQUATE
SPECIMEN QUALITY:: ADEQUATE
SPECIMEN QUALITY:: ADEQUATE

## 2022-09-12 LAB — OVA AND PARASITE EXAMINATION
CONCENTRATE RESULT:: NONE SEEN
MICRO NUMBER:: 14068966
SPECIMEN QUALITY:: ADEQUATE
TRICHROME RESULT:: NONE SEEN

## 2022-09-12 LAB — GIARDIA ANTIGEN
MICRO NUMBER:: 14068957
RESULT:: NOT DETECTED
SPECIMEN QUALITY:: ADEQUATE

## 2022-09-14 ENCOUNTER — Other Ambulatory Visit: Payer: Self-pay | Admitting: Sports Medicine

## 2022-10-10 ENCOUNTER — Encounter (INDEPENDENT_AMBULATORY_CARE_PROVIDER_SITE_OTHER): Payer: Self-pay | Admitting: Sports Medicine

## 2022-10-10 DIAGNOSIS — F411 Generalized anxiety disorder: Secondary | ICD-10-CM

## 2022-10-13 MED ORDER — ALPRAZOLAM 1 MG PO TABS: 1.0000 mg | ORAL_TABLET | Freq: Two times a day (BID) | ORAL | 3 refills | Status: DC | PRN

## 2022-10-13 NOTE — Telephone Encounter (Signed)
I spent 5 total minutes of online digital evaluation and management services in this patient-initiated request for online care. 

## 2022-10-14 ENCOUNTER — Other Ambulatory Visit: Payer: Self-pay | Admitting: Sports Medicine

## 2022-10-14 DIAGNOSIS — I10 Essential (primary) hypertension: Secondary | ICD-10-CM

## 2023-03-18 ENCOUNTER — Other Ambulatory Visit: Payer: Self-pay | Admitting: Sports Medicine

## 2023-03-18 DIAGNOSIS — I10 Essential (primary) hypertension: Secondary | ICD-10-CM

## 2023-04-07 ENCOUNTER — Ambulatory Visit (INDEPENDENT_AMBULATORY_CARE_PROVIDER_SITE_OTHER): Payer: Self-pay | Admitting: Sports Medicine

## 2023-04-07 ENCOUNTER — Encounter: Payer: Self-pay | Admitting: Sports Medicine

## 2023-04-07 VITALS — BP 123/83 | HR 63 | Wt 170.0 lb

## 2023-04-07 DIAGNOSIS — K219 Gastro-esophageal reflux disease without esophagitis: Secondary | ICD-10-CM | POA: Insufficient documentation

## 2023-04-07 MED ORDER — PANTOPRAZOLE SODIUM 40 MG PO TBEC
40.0000 mg | DELAYED_RELEASE_TABLET | Freq: Every day | ORAL | 3 refills | Status: AC
Start: 2023-04-07 — End: ?

## 2023-04-07 NOTE — Assessment & Plan Note (Signed)
This is a pleasant 52 year old male, he is postcholecystectomy, he reports intermittent episodes of burning sensation in his throat, burping, yellowish fluid reflux. These episodes are quite intermittent, he denies any melena, hematochezia, no midepigastric pain. No greenish vomitus. He does drink a great deal of tea every day. He was concerned that this was bile reflux however I informed him this sounded far more likely to be acid reflux. We will add pantoprazole, he will do some significant lifestyle changes including avoiding eating before bedtime, as well as avoiding foods that precipitate reflux such as coffee, tea, peppermint, alcohol, chocolate. If insufficient improvement after about 6 weeks we will proceed with upper endoscopy.

## 2023-04-07 NOTE — Progress Notes (Signed)
    Procedures performed today:    None.  Independent interpretation of notes and tests performed by another provider:   None.  Brief History, Exam, Impression, and Recommendations:    GERD (gastroesophageal reflux disease) This is a pleasant 52 year old male, he is postcholecystectomy, he reports intermittent episodes of burning sensation in his throat, burping, yellowish fluid reflux. These episodes are quite intermittent, he denies any melena, hematochezia, no midepigastric pain. No greenish vomitus. He does drink a great deal of tea every day. He was concerned that this was bile reflux however I informed him this sounded far more likely to be acid reflux. We will add pantoprazole, he will do some significant lifestyle changes including avoiding eating before bedtime, as well as avoiding foods that precipitate reflux such as coffee, tea, peppermint, alcohol, chocolate. If insufficient improvement after about 6 weeks we will proceed with upper endoscopy.    ____________________________________________ Ihor Austin. Benjamin Stain, M.D., ABFM., CAQSM., AME. Primary Care and Sports Medicine [facility]  Adjunct Professor of Family Medicine  [place] [employer] [facility] [employer]  Restaurant manager, fast food

## 2023-04-16 ENCOUNTER — Encounter: Payer: Self-pay | Admitting: Sports Medicine

## 2023-04-16 DIAGNOSIS — F411 Generalized anxiety disorder: Secondary | ICD-10-CM

## 2023-04-16 MED ORDER — ALPRAZOLAM 1 MG PO TABS
1.0000 mg | ORAL_TABLET | Freq: Two times a day (BID) | ORAL | 3 refills | Status: DC | PRN
Start: 2023-04-16 — End: 2023-09-04

## 2023-06-11 ENCOUNTER — Other Ambulatory Visit: Payer: Self-pay | Admitting: Sports Medicine

## 2023-06-11 DIAGNOSIS — I10 Essential (primary) hypertension: Secondary | ICD-10-CM

## 2023-09-04 ENCOUNTER — Other Ambulatory Visit: Payer: Self-pay | Admitting: Sports Medicine

## 2023-09-04 DIAGNOSIS — F411 Generalized anxiety disorder: Secondary | ICD-10-CM

## 2023-09-04 DIAGNOSIS — I10 Essential (primary) hypertension: Secondary | ICD-10-CM

## 2023-10-21 ENCOUNTER — Other Ambulatory Visit: Payer: Self-pay | Admitting: Sports Medicine

## 2023-10-21 DIAGNOSIS — F411 Generalized anxiety disorder: Secondary | ICD-10-CM

## 2023-12-11 ENCOUNTER — Other Ambulatory Visit: Payer: Self-pay | Admitting: Sports Medicine

## 2023-12-11 DIAGNOSIS — F411 Generalized anxiety disorder: Secondary | ICD-10-CM

## 2023-12-11 DIAGNOSIS — I10 Essential (primary) hypertension: Secondary | ICD-10-CM

## 2024-01-10 ENCOUNTER — Encounter: Payer: Self-pay | Admitting: Sports Medicine

## 2024-01-15 ENCOUNTER — Encounter: Payer: Self-pay | Admitting: Sports Medicine

## 2024-01-15 ENCOUNTER — Ambulatory Visit (INDEPENDENT_AMBULATORY_CARE_PROVIDER_SITE_OTHER): Payer: Self-pay | Admitting: Sports Medicine

## 2024-01-15 VITALS — BP 146/81 | HR 61 | Ht 68.0 in | Wt 172.0 lb

## 2024-01-15 DIAGNOSIS — Z0000 Encounter for general adult medical examination without abnormal findings: Secondary | ICD-10-CM

## 2024-01-15 DIAGNOSIS — Z87898 Personal history of other specified conditions: Secondary | ICD-10-CM

## 2024-01-15 DIAGNOSIS — E291 Testicular hypofunction: Secondary | ICD-10-CM

## 2024-01-15 DIAGNOSIS — E781 Pure hyperglyceridemia: Secondary | ICD-10-CM

## 2024-01-15 DIAGNOSIS — F411 Generalized anxiety disorder: Secondary | ICD-10-CM

## 2024-01-15 NOTE — Progress Notes (Signed)
 Subjective:    CC: Annual Physical Exam  HPI:  This patient is here for their annual physical  I reviewed the past medical history, family history, social history, surgical history, and allergies today and no changes were needed.  Please see the problem list section below in epic for further details.  Past Medical History: Past Medical History:  Diagnosis Date   Hypertension    Past Surgical History: Past Surgical History:  Procedure Laterality Date   CHOLECYSTECTOMY     Social History: Social History   Socioeconomic History   Marital status: Married    Spouse name: Not on file   Number of children: Not on file   Years of education: Not on file   Highest education level: 12th grade  Occupational History   Not on file  Tobacco Use   Smoking status: Never   Smokeless tobacco: Never  Vaping Use   Vaping status: Never Used  Substance and Sexual Activity   Alcohol use: No   Drug use: No   Sexual activity: Not on file  Other Topics Concern   Not on file  Social History Narrative   Not on file   Social Drivers of Health   Financial Resource Strain: Low Risk  (01/11/2024)   Overall Financial Resource Strain (CARDIA)    Difficulty of Paying Living Expenses: Not very hard  Food Insecurity: No Food Insecurity (01/11/2024)   Hunger Vital Sign    Worried About Running Out of Food in the Last Year: Never true    Ran Out of Food in the Last Year: Never true  Transportation Needs: No Transportation Needs (01/11/2024)   PRAPARE - Administrator, Civil Service (Medical): No    Lack of Transportation (Non-Medical): No  Physical Activity: Unknown (01/11/2024)   Exercise Vital Sign    Days of Exercise per Week: 0 days    Minutes of Exercise per Session: Not on file  Stress: No Stress Concern Present (01/11/2024)   [employer] of Occupational Health - Occupational Stress Questionnaire    Feeling of Stress : Only a little  Recent Concern: Stress - Stress Concern  Present (11/02/2023)   Received from [facility] of Occupational Health - Occupational Stress Questionnaire    Feeling of Stress : To some extent  Social Connections: Socially Isolated (01/11/2024)   Social Connection and Isolation Panel [NHANES]    Frequency of Communication with Friends and Family: Once a week    Frequency of Social Gatherings with Friends and Family: Once a week    Attends Religious Services: Never    Database administrator or Organizations: No    Attends Engineer, structural: Not on file    Marital Status: Married   Family History: Family History  Problem Relation Age of Onset   Heart attack Father    Allergies: No Known Allergies Medications: See med rec.  Review of Systems: No headache, visual changes, nausea, vomiting, diarrhea, constipation, dizziness, abdominal pain, skin rash, fevers, chills, night sweats, swollen lymph nodes, weight loss, chest pain, body aches, joint swelling, muscle aches, shortness of breath, mood changes, visual or auditory hallucinations.  Objective:    General: Well Developed, well nourished, and in no acute distress.  Neuro: Alert and oriented x3, extra-ocular muscles intact, sensation grossly intact. Cranial nerves II through XII are intact, motor, sensory, and coordinative functions are all intact. HEENT: Normocephalic, atraumatic, pupils equal round reactive to light, neck supple, no masses, no lymphadenopathy,  thyroid nonpalpable. Oropharynx, nasopharynx, external ear canals are unremarkable. Skin: Warm and dry, no rashes noted.  Cardiac: Regular rate and rhythm, no murmurs rubs or gallops.  Respiratory: Clear to auscultation bilaterally. Not using accessory muscles, speaking in full sentences.  Abdominal: Soft, nontender, nondistended, positive bowel sounds, no masses, no organomegaly.  Musculoskeletal: Shoulder, elbow, wrist, hip, knee, ankle stable, and with full range of motion.  Impression  and Recommendations:    The patient was counselled, risk factors were discussed, anticipatory guidance given.  Annual physical exam Annual physical as above. Michai did have a negative stress echo in 2015. He declines all screening measures, risks, benefits, alternatives understood.  Generalized anxiety disorder Chad does have significant anxiety, he presents with a multitude of complaints. Historically he has had to sit with earmuffs when he gets off of work as sounds have tended to irritate him. There is significant agoraphobia in day-to-day life and he needs alprazolam. He has tried controller agents in the past including lithium, tricyclic's, Prozac. We did suggest Zoloft, however he is not taking this. He is looking into disability, I did inform him that he would likely need consultations with an independent psychiatrist, neurologist, and disability would not be able to be determined until the patient had treatment. Ultimately explained him the goal was return to work and return to function. He did have an episode of elevated blood pressure, palpitations after an epidural injection, this ended him up in the emergency department. He was discharged after a negative workup.  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 I do suspect some of this is related to uncontrolled mood disorder. I would like neurology to weigh in.  I spent 40 minutes of total time managing this patient today, this includes chart review, face to face, and non-face to face time.  This was separate from the time spent performing the above physical examination.  ____________________________________________ Ihor Austin. Benjamin Stain, M.D., ABFM., CAQSM., AME. Primary Care and Sports Medicine [facility]  Adjunct Professor  of Family Medicine  [place] [employer] [facility] [employer]  Restaurant manager, fast food

## 2024-01-15 NOTE — Assessment & Plan Note (Signed)
 Annual physical as above. Colin Carter did have a negative stress echo in 2015. He declines all screening measures, risks, benefits, alternatives understood.

## 2024-01-15 NOTE — Assessment & Plan Note (Signed)
 Colin Carter does have subjective progressive weakness, intermittent migratory paresthesias, significant brain fog, he is getting epidural injections, I have suggested consultation with neurology. I do suspect some of this is related to uncontrolled mood disorder. I would like neurology to weigh in.

## 2024-01-15 NOTE — Assessment & Plan Note (Signed)
 Colin Carter does have significant anxiety, he presents with a multitude of complaints. Historically he has had to sit with earmuffs when he gets off of work as sounds have tended to irritate him. There is significant agoraphobia in day-to-day life and he needs alprazolam. He has tried controller agents in the past including lithium, tricyclic's, Prozac. We did suggest Zoloft, however he is not taking this. He is looking into disability, I did inform him that he would likely need consultations with an independent psychiatrist, neurologist, and disability would not be able to be determined until the patient had treatment. Ultimately explained him the goal was return to work and return to function. He did have an episode of elevated blood pressure, palpitations after an epidural injection, this ended him up in the emergency department. He was discharged after a negative workup.

## 2024-01-21 LAB — CMP14+EGFR
ALT: 48 IU/L — ABNORMAL HIGH (ref 0–44)
AST: 25 IU/L (ref 0–40)
Albumin: 4.7 g/dL (ref 3.8–4.9)
Alkaline Phosphatase: 108 IU/L (ref 44–121)
BUN/Creatinine Ratio: 10 (ref 9–20)
BUN: 12 mg/dL (ref 6–24)
Bilirubin Total: 0.5 mg/dL (ref 0.0–1.2)
CO2: 23 mmol/L (ref 20–29)
Calcium: 9.6 mg/dL (ref 8.7–10.2)
Chloride: 99 mmol/L (ref 96–106)
Creatinine, Ser: 1.15 mg/dL (ref 0.76–1.27)
Globulin, Total: 2.3 g/dL (ref 1.5–4.5)
Glucose: 94 mg/dL (ref 70–99)
Potassium: 4.4 mmol/L (ref 3.5–5.2)
Sodium: 138 mmol/L (ref 134–144)
Total Protein: 7 g/dL (ref 6.0–8.5)
eGFR: 77 mL/min/1.73 (ref >59)

## 2024-01-21 LAB — LIPID PANEL
Chol/HDL Ratio: 4.4 ratio (ref 0.0–5.0)
Cholesterol, Total: 228 mg/dL — ABNORMAL HIGH (ref 100–199)
HDL: 52 mg/dL (ref >39)
LDL Chol Calc (NIH): 142 mg/dL — ABNORMAL HIGH (ref 0–99)
Triglycerides: 191 mg/dL — ABNORMAL HIGH (ref 0–149)
VLDL Cholesterol Cal: 34 mg/dL (ref 5–40)

## 2024-01-21 LAB — TESTOSTERONE, FREE, TOTAL, SHBG
Sex Hormone Binding: 25 nmol/L (ref 19.3–76.4)
Testosterone, Free: 27.2 pg/mL — ABNORMAL HIGH (ref 7.2–24.0)
Testosterone: 408 ng/dL (ref 264–916)

## 2024-01-21 LAB — TSH: TSH: 1.17 u[IU]/mL (ref 0.450–4.500)

## 2024-02-29 ENCOUNTER — Other Ambulatory Visit: Payer: Self-pay | Admitting: Sports Medicine

## 2024-02-29 DIAGNOSIS — I10 Essential (primary) hypertension: Secondary | ICD-10-CM

## 2024-02-29 DIAGNOSIS — F411 Generalized anxiety disorder: Secondary | ICD-10-CM

## 2024-05-24 ENCOUNTER — Other Ambulatory Visit: Payer: Self-pay | Admitting: Sports Medicine

## 2024-05-24 DIAGNOSIS — F411 Generalized anxiety disorder: Secondary | ICD-10-CM

## 2024-07-19 ENCOUNTER — Encounter: Payer: Self-pay | Admitting: Sports Medicine

## 2024-08-17 DIAGNOSIS — F411 Generalized anxiety disorder: Secondary | ICD-10-CM | POA: Diagnosis not present

## 2024-08-17 DIAGNOSIS — I10 Essential (primary) hypertension: Secondary | ICD-10-CM | POA: Diagnosis not present

## 2024-08-17 DIAGNOSIS — Z1330 Encounter for screening examination for mental health and behavioral disorders, unspecified: Secondary | ICD-10-CM | POA: Diagnosis not present

## 2024-08-17 DIAGNOSIS — R5383 Other fatigue: Secondary | ICD-10-CM | POA: Diagnosis not present

## 2024-08-17 DIAGNOSIS — G8929 Other chronic pain: Secondary | ICD-10-CM | POA: Diagnosis not present

## 2024-08-17 DIAGNOSIS — R7301 Impaired fasting glucose: Secondary | ICD-10-CM | POA: Diagnosis not present

## 2024-08-17 DIAGNOSIS — M6281 Muscle weakness (generalized): Secondary | ICD-10-CM | POA: Diagnosis not present

## 2024-08-17 DIAGNOSIS — R4189 Other symptoms and signs involving cognitive functions and awareness: Secondary | ICD-10-CM | POA: Diagnosis not present

## 2024-08-17 DIAGNOSIS — M5442 Lumbago with sciatica, left side: Secondary | ICD-10-CM | POA: Diagnosis not present

## 2024-08-23 ENCOUNTER — Other Ambulatory Visit (HOSPITAL_BASED_OUTPATIENT_CLINIC_OR_DEPARTMENT_OTHER): Payer: Self-pay

## 2024-08-23 DIAGNOSIS — F411 Generalized anxiety disorder: Secondary | ICD-10-CM

## 2024-08-23 MED ORDER — ALPRAZOLAM 1 MG PO TABS: 1.0000 mg | ORAL_TABLET | Freq: Two times a day (BID) | ORAL | 0 refills | Status: AC | PRN

## 2024-09-01 DIAGNOSIS — R4189 Other symptoms and signs involving cognitive functions and awareness: Secondary | ICD-10-CM | POA: Diagnosis not present

## 2024-09-01 DIAGNOSIS — M6281 Muscle weakness (generalized): Secondary | ICD-10-CM | POA: Diagnosis not present

## 2024-11-11 ENCOUNTER — Other Ambulatory Visit (HOSPITAL_BASED_OUTPATIENT_CLINIC_OR_DEPARTMENT_OTHER): Payer: Self-pay | Admitting: Family Medicine

## 2024-11-11 DIAGNOSIS — F411 Generalized anxiety disorder: Secondary | ICD-10-CM
